# Patient Record
Sex: Female | Born: 1980 | Race: Black or African American | Hispanic: No | Marital: Single
Health system: Midwestern US, Community
[De-identification: ages and names within clinical notes are randomized; demographics above are authoritative.]

---

## 2000-09-10 ENCOUNTER — Inpatient Hospital Stay (HOSPITAL_COMMUNITY): Admission: AD | Admit: 2000-09-10 | Discharge: 2000-09-10 | Payer: Self-pay | Admitting: Obstetrics

## 2000-09-23 ENCOUNTER — Inpatient Hospital Stay (HOSPITAL_COMMUNITY): Admission: AD | Admit: 2000-09-23 | Discharge: 2000-09-23 | Payer: Self-pay | Admitting: *Deleted

## 2000-09-23 ENCOUNTER — Encounter: Payer: Self-pay | Admitting: *Deleted

## 2000-11-07 ENCOUNTER — Other Ambulatory Visit: Admission: RE | Admit: 2000-11-07 | Discharge: 2000-11-07 | Payer: Self-pay | Admitting: Obstetrics and Gynecology

## 2003-10-28 ENCOUNTER — Emergency Department (HOSPITAL_COMMUNITY): Admission: AD | Admit: 2003-10-28 | Discharge: 2003-10-28 | Payer: Self-pay | Admitting: Emergency Medicine

## 2004-11-26 ENCOUNTER — Emergency Department (HOSPITAL_COMMUNITY): Admission: EM | Admit: 2004-11-26 | Discharge: 2004-11-26 | Payer: Self-pay | Admitting: Emergency Medicine

## 2005-08-19 ENCOUNTER — Inpatient Hospital Stay (HOSPITAL_COMMUNITY): Admission: AD | Admit: 2005-08-19 | Discharge: 2005-08-19 | Payer: Self-pay | Admitting: Obstetrics & Gynecology

## 2005-08-31 ENCOUNTER — Inpatient Hospital Stay (HOSPITAL_COMMUNITY): Admission: AD | Admit: 2005-08-31 | Discharge: 2005-08-31 | Payer: Self-pay | Admitting: Obstetrics and Gynecology

## 2005-09-11 ENCOUNTER — Other Ambulatory Visit: Admission: RE | Admit: 2005-09-11 | Discharge: 2005-09-11 | Payer: Self-pay | Admitting: Obstetrics and Gynecology

## 2005-10-13 ENCOUNTER — Inpatient Hospital Stay (HOSPITAL_COMMUNITY): Admission: AD | Admit: 2005-10-13 | Discharge: 2005-10-13 | Payer: Self-pay

## 2005-10-30 ENCOUNTER — Ambulatory Visit (HOSPITAL_COMMUNITY): Admission: RE | Admit: 2005-10-30 | Discharge: 2005-10-30 | Payer: Self-pay | Admitting: Obstetrics and Gynecology

## 2006-01-28 ENCOUNTER — Emergency Department (HOSPITAL_COMMUNITY): Admission: EM | Admit: 2006-01-28 | Discharge: 2006-01-28 | Payer: Self-pay | Admitting: Emergency Medicine

## 2006-02-20 ENCOUNTER — Inpatient Hospital Stay (HOSPITAL_COMMUNITY): Admission: AD | Admit: 2006-02-20 | Discharge: 2006-02-22 | Payer: Self-pay | Admitting: Obstetrics and Gynecology

## 2006-02-27 ENCOUNTER — Inpatient Hospital Stay (HOSPITAL_COMMUNITY): Admission: AD | Admit: 2006-02-27 | Discharge: 2006-02-27 | Payer: Self-pay | Admitting: Obstetrics and Gynecology

## 2006-03-01 ENCOUNTER — Inpatient Hospital Stay (HOSPITAL_COMMUNITY): Admission: AD | Admit: 2006-03-01 | Discharge: 2006-03-02 | Payer: Self-pay | Admitting: Obstetrics and Gynecology

## 2006-04-08 ENCOUNTER — Inpatient Hospital Stay (HOSPITAL_COMMUNITY): Admission: AD | Admit: 2006-04-08 | Discharge: 2006-04-10 | Payer: Self-pay | Admitting: Obstetrics and Gynecology

## 2006-09-16 ENCOUNTER — Emergency Department (HOSPITAL_COMMUNITY): Admission: EM | Admit: 2006-09-16 | Discharge: 2006-09-16 | Payer: Self-pay | Admitting: Family Medicine

## 2006-12-08 ENCOUNTER — Inpatient Hospital Stay (HOSPITAL_COMMUNITY): Admission: AD | Admit: 2006-12-08 | Discharge: 2006-12-08 | Payer: Self-pay | Admitting: Obstetrics and Gynecology

## 2007-07-24 ENCOUNTER — Emergency Department (HOSPITAL_COMMUNITY): Admission: EM | Admit: 2007-07-24 | Discharge: 2007-07-24 | Payer: Self-pay | Admitting: Emergency Medicine

## 2007-08-05 ENCOUNTER — Encounter: Payer: Self-pay | Admitting: Obstetrics and Gynecology

## 2007-08-05 ENCOUNTER — Ambulatory Visit: Payer: Self-pay | Admitting: Obstetrics and Gynecology

## 2007-08-05 ENCOUNTER — Ambulatory Visit (HOSPITAL_COMMUNITY): Admission: AD | Admit: 2007-08-05 | Discharge: 2007-08-05 | Payer: Self-pay | Admitting: Gynecology

## 2007-08-11 ENCOUNTER — Ambulatory Visit: Payer: Self-pay | Admitting: Obstetrics & Gynecology

## 2007-08-12 ENCOUNTER — Ambulatory Visit: Payer: Self-pay | Admitting: Obstetrics and Gynecology

## 2007-08-26 ENCOUNTER — Ambulatory Visit: Payer: Self-pay | Admitting: Obstetrics and Gynecology

## 2007-08-26 ENCOUNTER — Inpatient Hospital Stay (HOSPITAL_COMMUNITY): Admission: AD | Admit: 2007-08-26 | Discharge: 2007-08-26 | Payer: Self-pay | Admitting: Obstetrics & Gynecology

## 2008-03-29 ENCOUNTER — Inpatient Hospital Stay (HOSPITAL_COMMUNITY): Admission: AD | Admit: 2008-03-29 | Discharge: 2008-03-30 | Payer: Self-pay | Admitting: Obstetrics & Gynecology

## 2008-04-04 ENCOUNTER — Inpatient Hospital Stay (HOSPITAL_COMMUNITY): Admission: RE | Admit: 2008-04-04 | Discharge: 2008-04-04 | Payer: Self-pay | Admitting: Obstetrics and Gynecology

## 2008-04-18 ENCOUNTER — Ambulatory Visit (HOSPITAL_COMMUNITY): Admission: RE | Admit: 2008-04-18 | Discharge: 2008-04-18 | Payer: Self-pay | Admitting: Obstetrics and Gynecology

## 2008-07-19 ENCOUNTER — Inpatient Hospital Stay (HOSPITAL_COMMUNITY): Admission: AD | Admit: 2008-07-19 | Discharge: 2008-07-19 | Payer: Self-pay | Admitting: Family Medicine

## 2008-07-21 ENCOUNTER — Inpatient Hospital Stay (HOSPITAL_COMMUNITY): Admission: RE | Admit: 2008-07-21 | Discharge: 2008-07-21 | Payer: Self-pay | Admitting: Family Medicine

## 2008-08-03 ENCOUNTER — Inpatient Hospital Stay (HOSPITAL_COMMUNITY): Admission: RE | Admit: 2008-08-03 | Discharge: 2008-08-03 | Payer: Self-pay | Admitting: Family Medicine

## 2008-08-12 ENCOUNTER — Inpatient Hospital Stay (HOSPITAL_COMMUNITY): Admission: AD | Admit: 2008-08-12 | Discharge: 2008-08-12 | Payer: Self-pay | Admitting: Obstetrics & Gynecology

## 2008-09-13 ENCOUNTER — Inpatient Hospital Stay (HOSPITAL_COMMUNITY): Admission: AD | Admit: 2008-09-13 | Discharge: 2008-09-13 | Payer: Self-pay | Admitting: Family Medicine

## 2008-10-05 ENCOUNTER — Inpatient Hospital Stay (HOSPITAL_COMMUNITY): Admission: AD | Admit: 2008-10-05 | Discharge: 2008-10-05 | Payer: Self-pay | Admitting: Obstetrics

## 2008-10-27 ENCOUNTER — Ambulatory Visit (HOSPITAL_COMMUNITY): Admission: RE | Admit: 2008-10-27 | Discharge: 2008-10-27 | Payer: Self-pay | Admitting: Obstetrics

## 2008-11-21 ENCOUNTER — Inpatient Hospital Stay (HOSPITAL_COMMUNITY): Admission: AD | Admit: 2008-11-21 | Discharge: 2008-11-21 | Payer: Self-pay | Admitting: Obstetrics

## 2008-12-06 ENCOUNTER — Inpatient Hospital Stay (HOSPITAL_COMMUNITY): Admission: AD | Admit: 2008-12-06 | Discharge: 2008-12-06 | Payer: Self-pay | Admitting: Obstetrics

## 2009-01-06 ENCOUNTER — Ambulatory Visit (HOSPITAL_COMMUNITY): Admission: RE | Admit: 2009-01-06 | Discharge: 2009-01-06 | Payer: Self-pay | Admitting: Obstetrics

## 2009-01-18 ENCOUNTER — Inpatient Hospital Stay (HOSPITAL_COMMUNITY): Admission: AD | Admit: 2009-01-18 | Discharge: 2009-01-19 | Payer: Self-pay | Admitting: Obstetrics

## 2009-01-27 ENCOUNTER — Ambulatory Visit (HOSPITAL_COMMUNITY): Admission: RE | Admit: 2009-01-27 | Discharge: 2009-01-27 | Payer: Self-pay | Admitting: Obstetrics

## 2009-02-02 ENCOUNTER — Encounter: Payer: Self-pay | Admitting: Obstetrics

## 2009-02-02 ENCOUNTER — Inpatient Hospital Stay (HOSPITAL_COMMUNITY): Admission: AD | Admit: 2009-02-02 | Discharge: 2009-02-02 | Payer: Self-pay | Admitting: Obstetrics

## 2009-02-04 ENCOUNTER — Encounter (INDEPENDENT_AMBULATORY_CARE_PROVIDER_SITE_OTHER): Payer: Self-pay | Admitting: Obstetrics

## 2009-02-04 ENCOUNTER — Inpatient Hospital Stay (HOSPITAL_COMMUNITY): Admission: AD | Admit: 2009-02-04 | Discharge: 2009-02-07 | Payer: Self-pay | Admitting: Obstetrics

## 2009-02-14 ENCOUNTER — Inpatient Hospital Stay (HOSPITAL_COMMUNITY): Admission: AD | Admit: 2009-02-14 | Discharge: 2009-02-15 | Payer: Self-pay | Admitting: Obstetrics

## 2009-10-11 ENCOUNTER — Emergency Department (HOSPITAL_COMMUNITY): Admission: EM | Admit: 2009-10-11 | Discharge: 2009-10-11 | Payer: Self-pay | Admitting: Emergency Medicine

## 2010-05-19 IMAGING — US US OB TRANSVAGINAL
1 series · 14 of 28 positions shown · non-contrast
Comparison: None

CLINICAL DATA: Heavy bleeding.

OBSTETRIC <14 WK ULTRASOUND
TECHNIQUE: Transabdominal ultrasound was performed for evaluation
of the gestation as well as the maternal uterus and adnexal
regions.

[Series 1: us ob transvaginal · 14 of 54 slices shown]
[im 2/54]
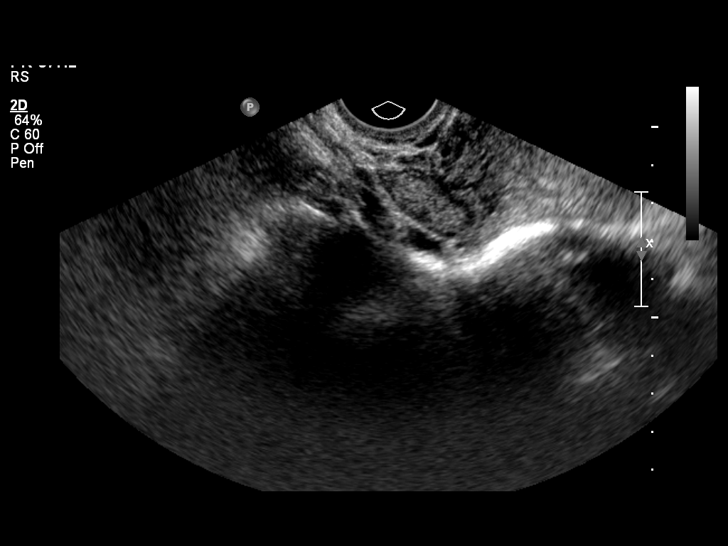
[im 6/54]
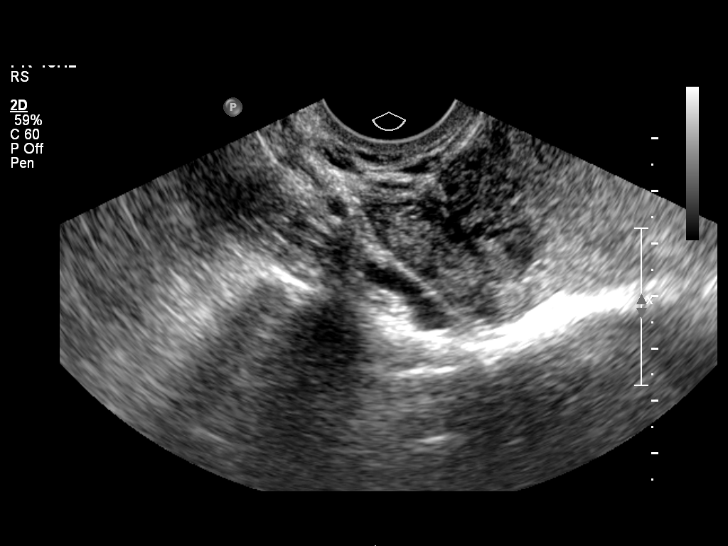
[im 10/54]
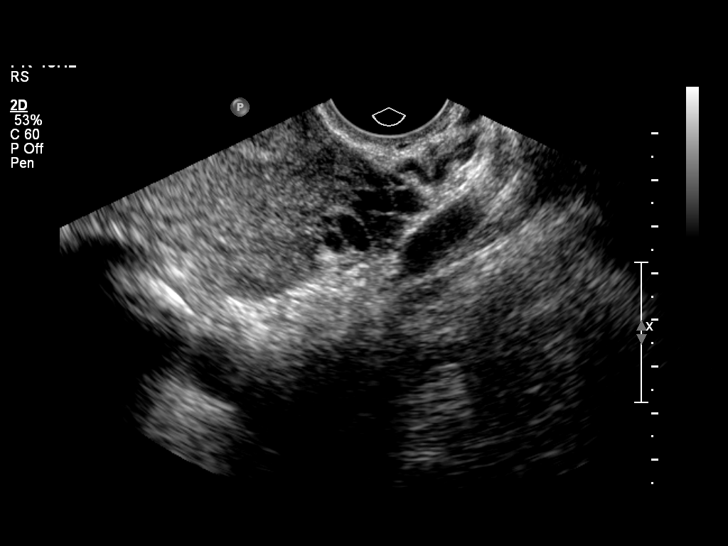
[im 14/54]
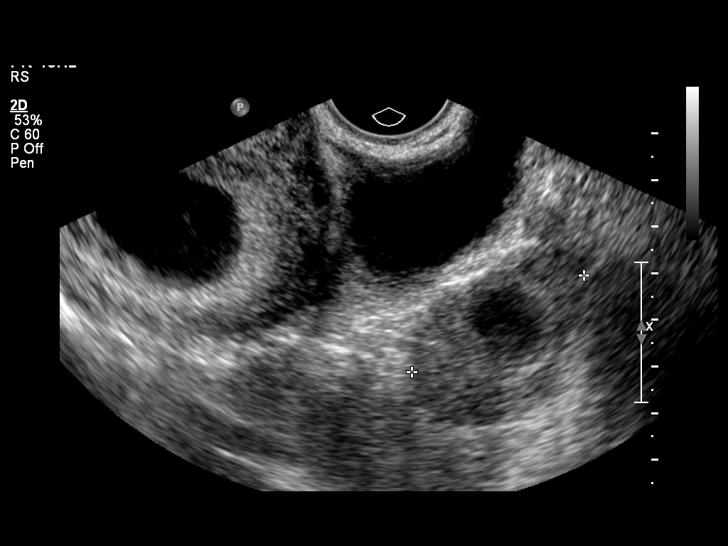
[im 18/54]
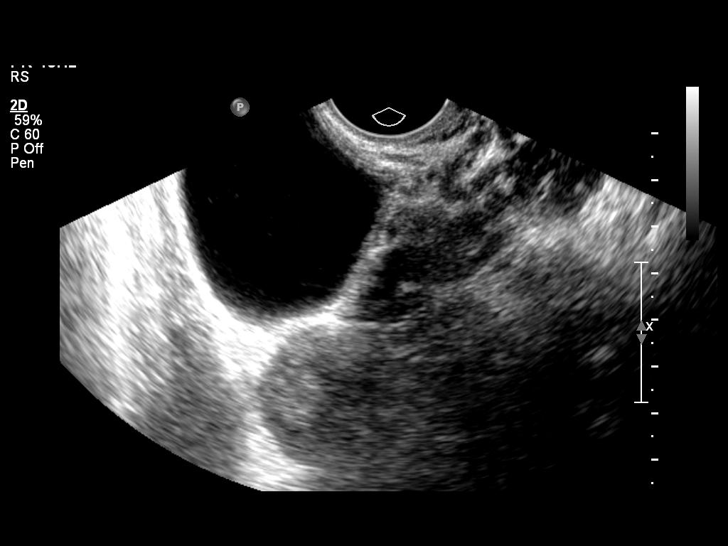
[im 22/54]
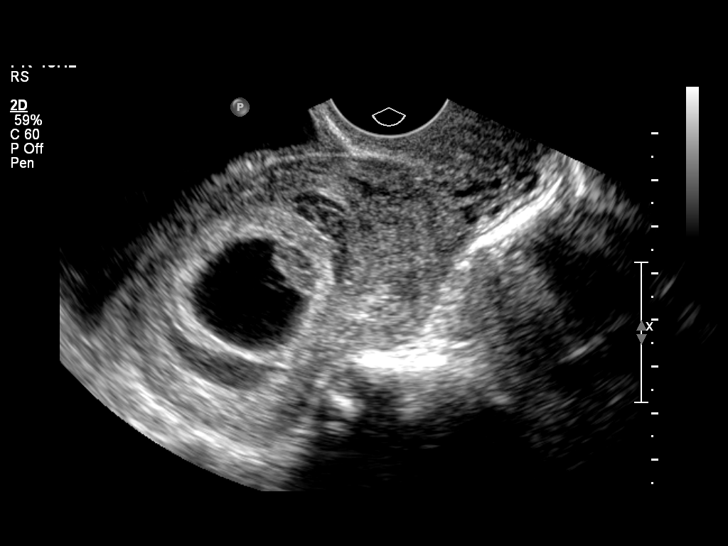
[im 26/54]
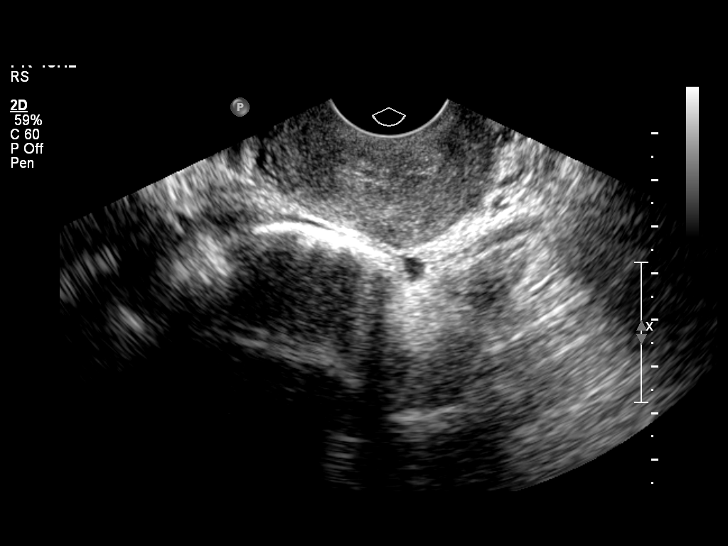
[im 30/54]
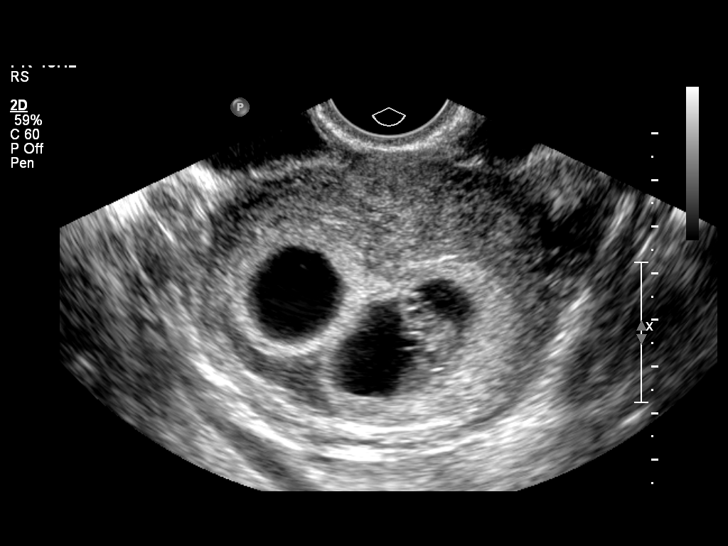
[im 34/54]
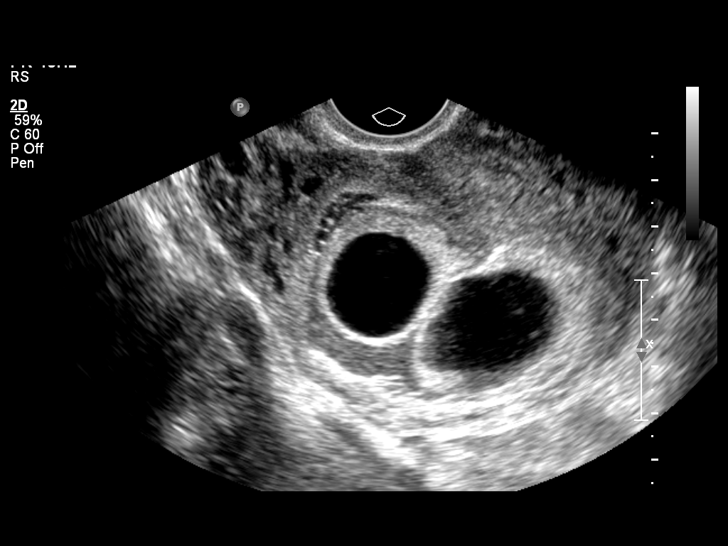
[im 38/54]
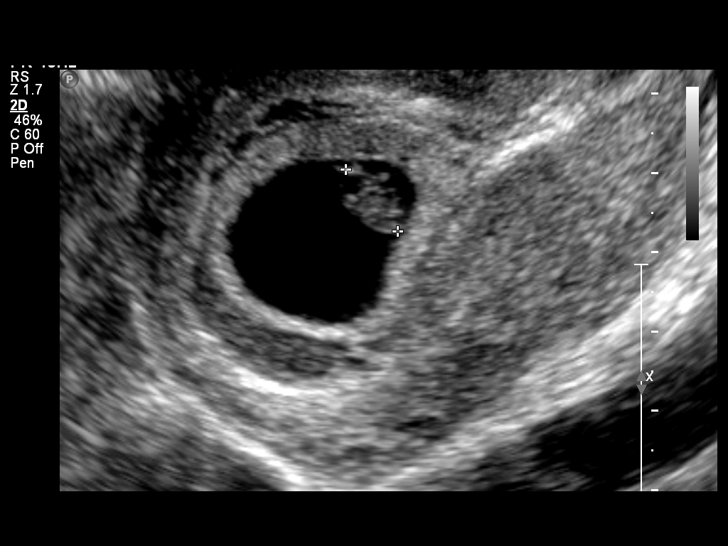
[im 42/54]
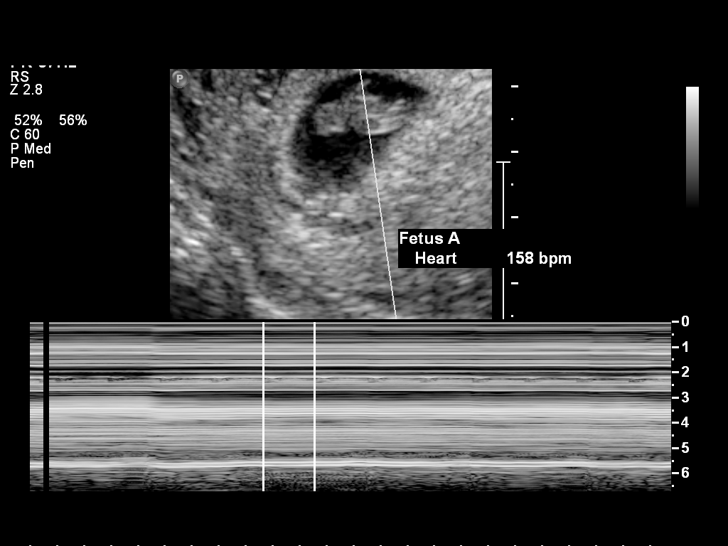
[im 46/54]
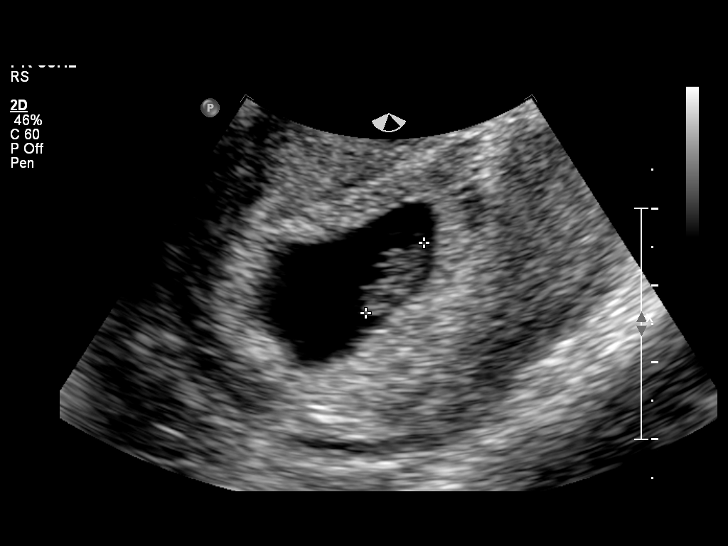
[im 50/54]
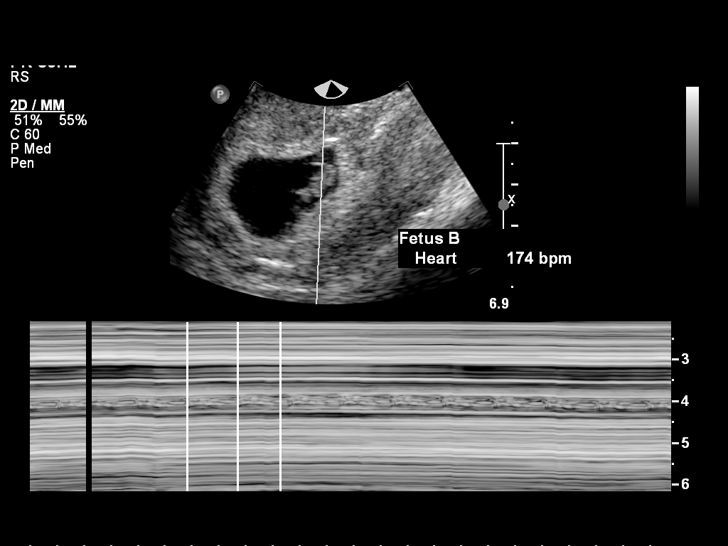
[im 54/54]
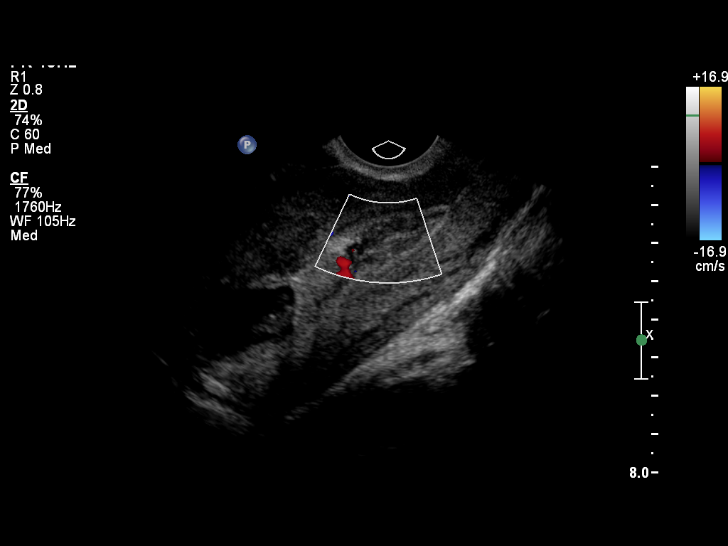

[14 of 28 positions shown; findings below may reference images not displayed]

FINDINGS: Intrauterine gestational sac: 2]
Yolk sac: Yes
Embryo: Yes
Cardiac Activity: Yes
Heart Rate: Twin A 158 bpm; twin B 174 bpm

Twin A: CRL:  1.09 cm         7w  2d            US EDC: 03/29/2009

Twin B:  CRL:  [1.16 cm...]         [7]w  [3]d        US EDC:
[03/29/2009]

Maternal uterus/Adnexae:
Small amount of subchorionic hemorrhage is identified.  Right ovary
unremarkable.  Corpus luteum cyst is seen in the left ovary.  Small
amount of free fluid.
IMPRESSION: 1.  Twin intrauterine pregnancy with small subchorionic hemorrhage.
2.  Small free fluid.

## 2010-08-03 IMAGING — US US OB DETAIL+14 WK
2 series · 14 of 28 positions shown · non-contrast
Comparison: none

OBSTETRICAL ULTRASOUND:
 This ultrasound exam was performed in the [HOSPITAL] Ultrasound Department.  The OB US report was generated in the AS system, and faxed to the ordering physician.  This report is also available in [REDACTED] PACS.

[Series 1: us ob detail +14 wk · 1 of 12 slices shown (1 of 2)]
[im 12/12]
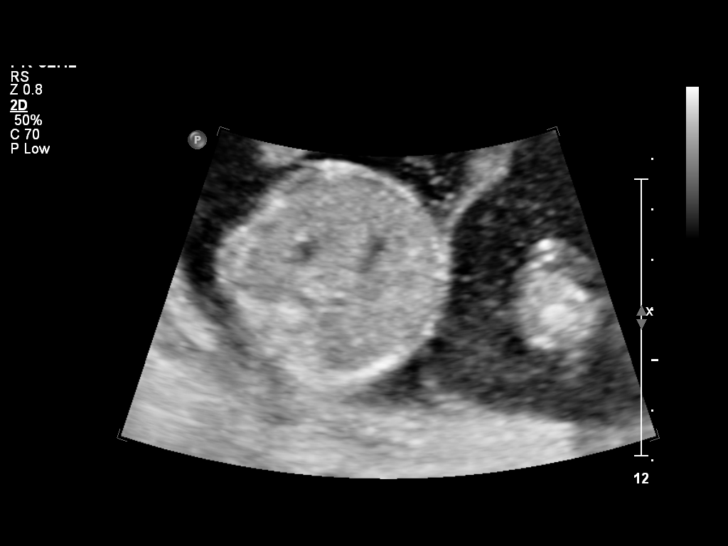

[Series 1: us ob detail +14 wk · 13 of 174 slices shown (2 of 2)]
[im 7/174]
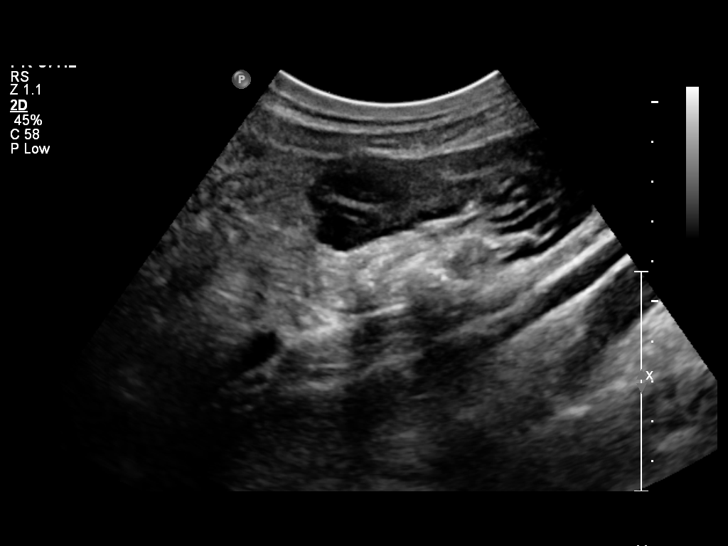
[im 21/174]
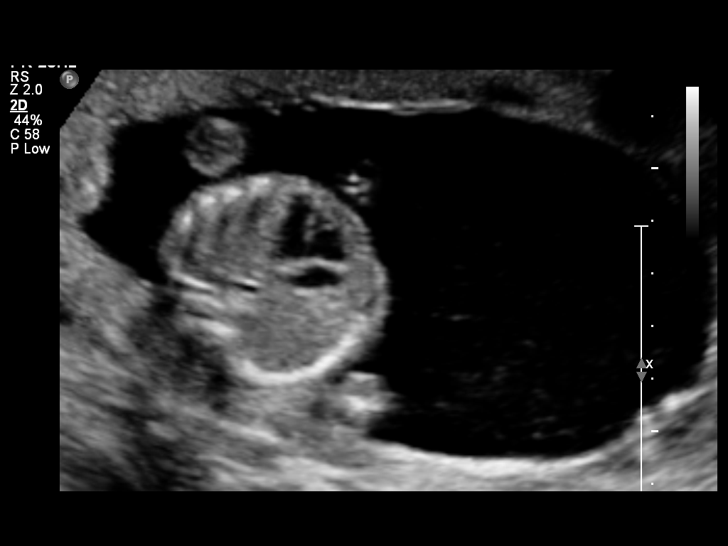
[im 35/174]
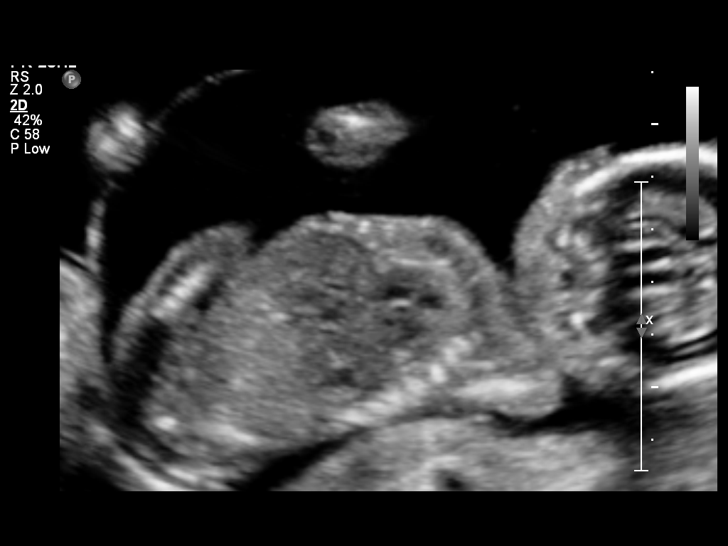
[im 49/174]
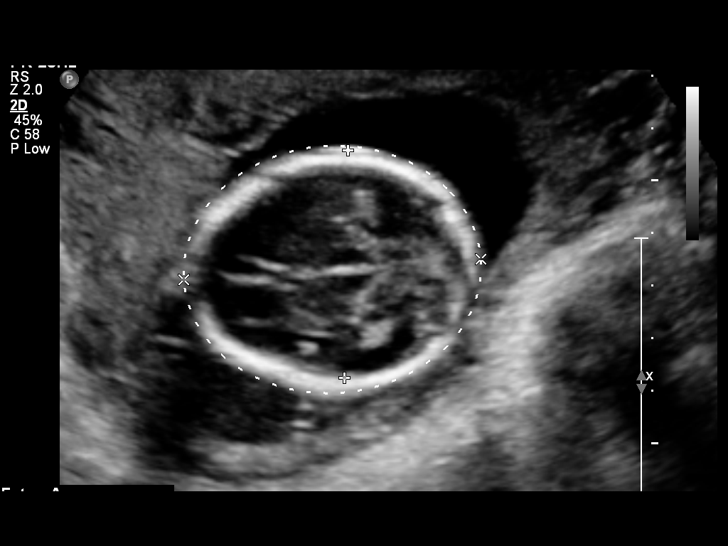
[im 63/174]
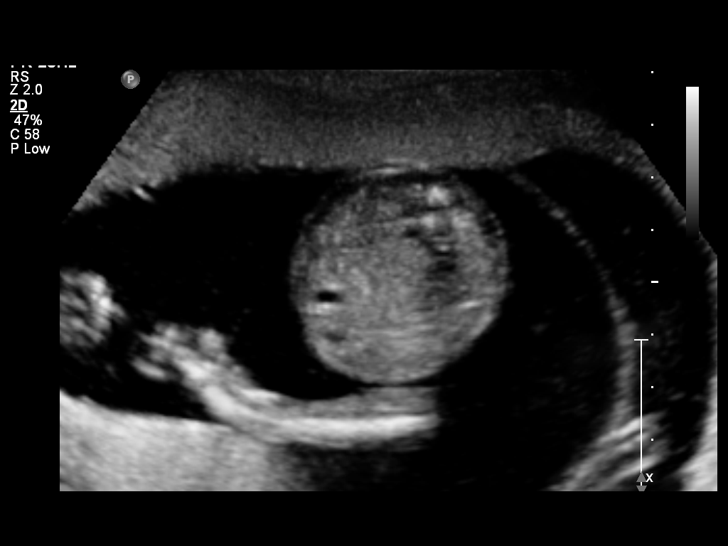
[im 77/174]
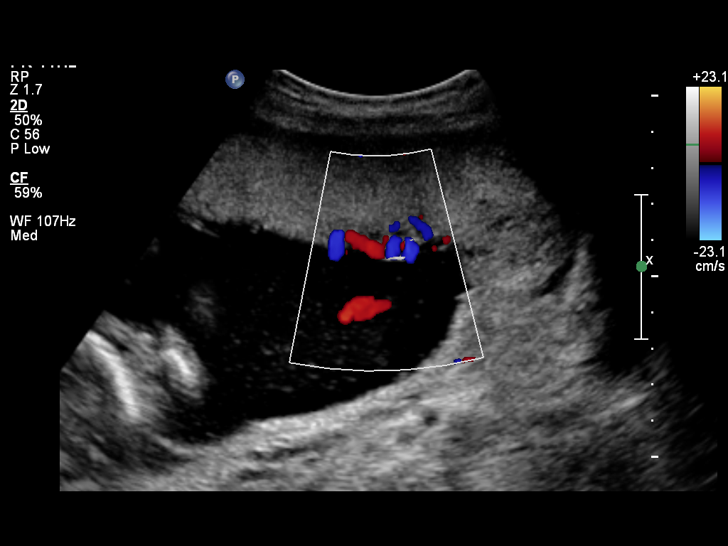
[im 90/174]
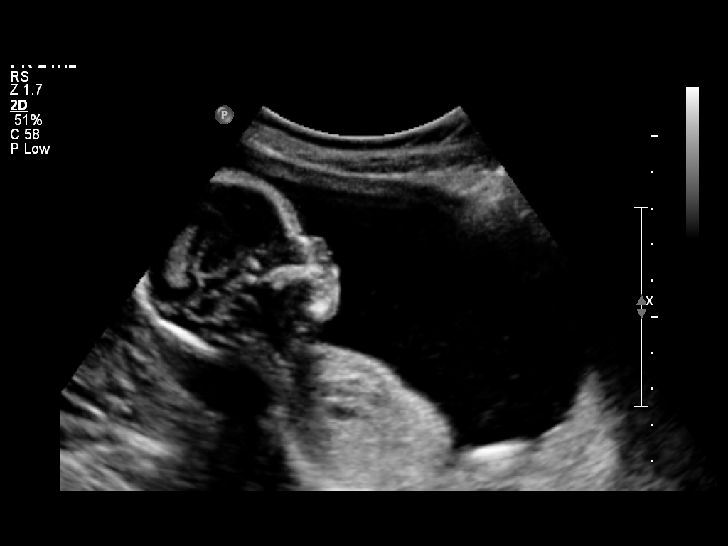
[im 104/174]
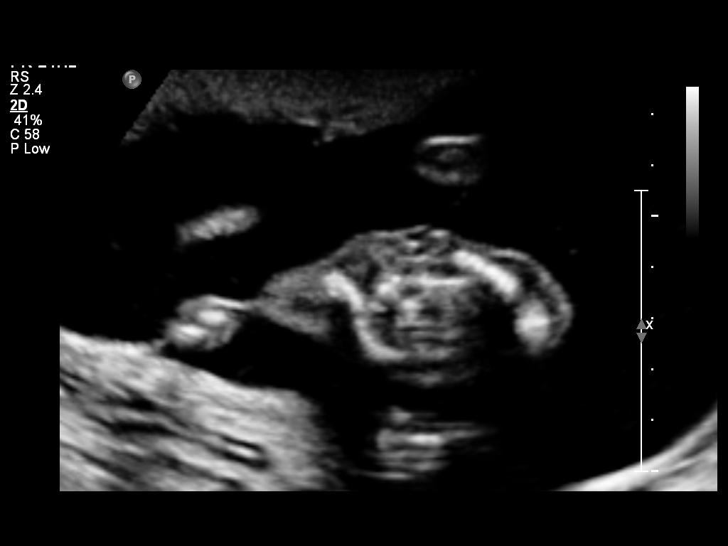
[im 118/174]
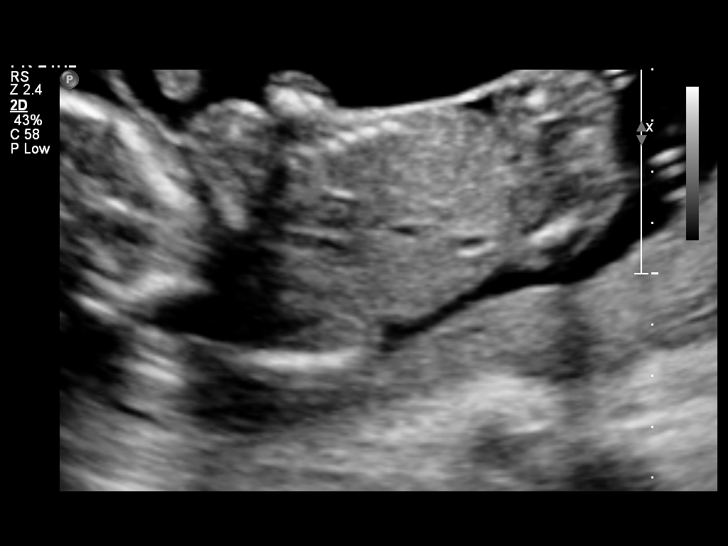
[im 132/174]
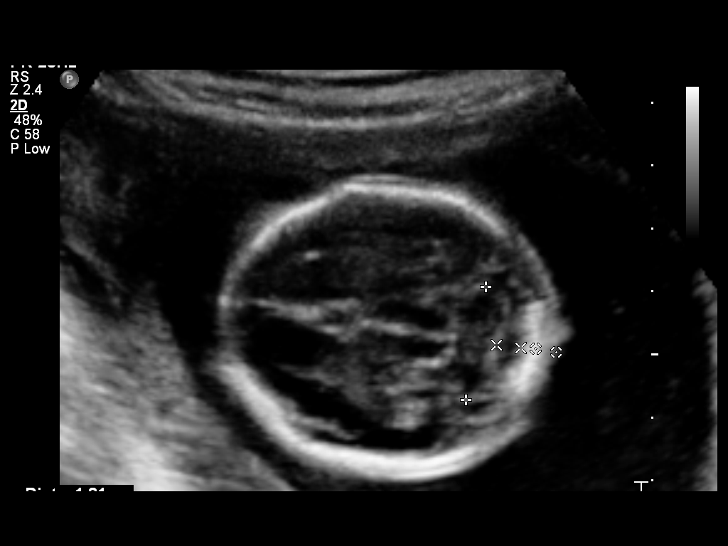
[im 146/174]
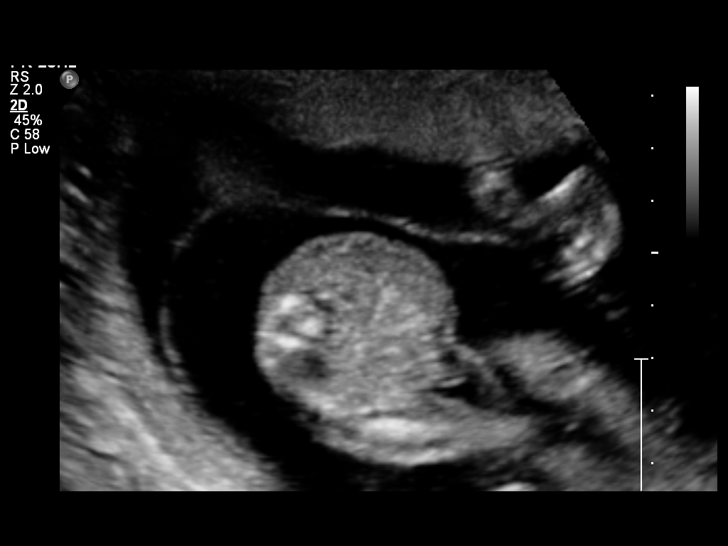
[im 160/174]
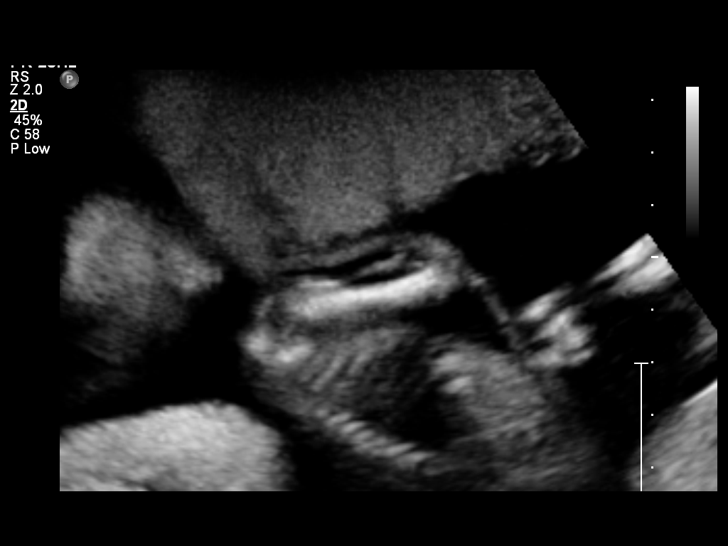
[im 174/174]
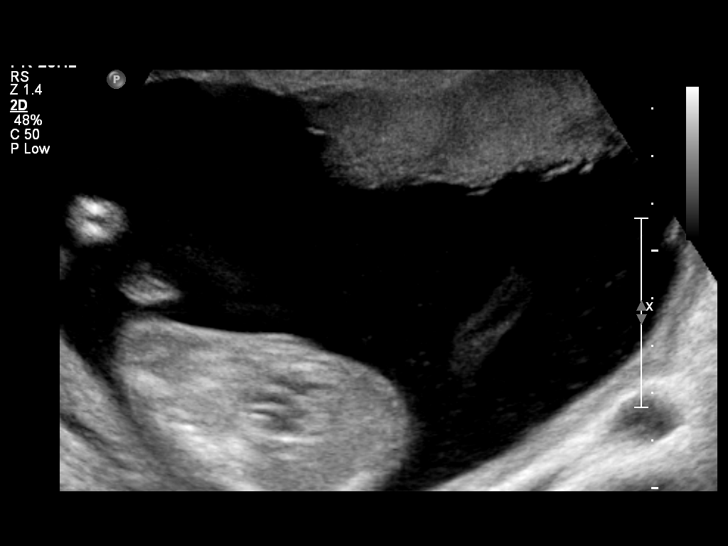

[14 of 28 positions shown; findings below may reference images not displayed]

IMPRESSION: See AS Obstetric US report.

## 2011-01-13 ENCOUNTER — Encounter: Payer: Self-pay | Admitting: Obstetrics

## 2011-01-14 ENCOUNTER — Encounter: Payer: Self-pay | Admitting: Family Medicine

## 2011-04-09 LAB — CBC
HCT: 25.8 % — ABNORMAL LOW (ref 36.0–46.0)
Hemoglobin: 7.6 g/dL — CL (ref 12.0–15.0)
Hemoglobin: 8.5 g/dL — ABNORMAL LOW (ref 12.0–15.0)
Hemoglobin: 8.4 g/dL — ABNORMAL LOW (ref 12.0–15.0)
MCHC: 33.1 g/dL (ref 30.0–36.0)
MCV: 87.6 fL (ref 78.0–100.0)
Platelets: 107 10*3/uL — ABNORMAL LOW (ref 150–400)
Platelets: 133 10*3/uL — ABNORMAL LOW (ref 150–400)
RBC: 2.94 MIL/uL — ABNORMAL LOW (ref 3.87–5.11)
RBC: 2.92 MIL/uL — ABNORMAL LOW (ref 3.87–5.11)
RDW: 14.5 % (ref 11.5–15.5)
RDW: 15.2 % (ref 11.5–15.5)
WBC: 12.6 10*3/uL — ABNORMAL HIGH (ref 4.0–10.5)
WBC: 10.8 10*3/uL — ABNORMAL HIGH (ref 4.0–10.5)

## 2011-04-09 LAB — COMPREHENSIVE METABOLIC PANEL
ALT: 18 U/L (ref 0–35)
AST: 17 U/L (ref 0–37)
Albumin: 2.7 g/dL — ABNORMAL LOW (ref 3.5–5.2)
Alkaline Phosphatase: 104 U/L (ref 39–117)
BUN: 12 mg/dL (ref 6–23)
CO2: 27 mEq/L (ref 19–32)
Calcium: 8.5 mg/dL (ref 8.4–10.5)
Chloride: 108 mEq/L (ref 96–112)
Creatinine, Ser: 0.55 mg/dL (ref 0.4–1.2)
GFR calc Af Amer: >60 mL/min (ref >60)
GFR calc non Af Amer: >60 mL/min (ref >60)
Glucose, Bld: 86 mg/dL (ref 70–99)
Potassium: 3.6 mEq/L (ref 3.5–5.1)
Sodium: 140 mEq/L (ref 135–145)
Total Bilirubin: 0.3 mg/dL (ref 0.3–1.2)
Total Protein: 6 g/dL (ref 6.0–8.3)

## 2011-04-09 LAB — RPR: RPR Ser Ql: NONREACTIVE

## 2011-04-09 LAB — LACTATE DEHYDROGENASE: LDH: 193 U/L (ref 94–250)

## 2011-05-07 NOTE — Consult Note (Signed)
NAME:  Maria, Maldonado NO.:  [id number]   MEDICAL RECORD NO.:  [id number]          PATIENT TYPE:  MAT   LOCATION:  MATC                          [facility]   PHYSICIAN:  Tilda Burrow, M.D. DATE OF BIRTH:  26-Jun-1981   DATE OF CONSULTATION:  DATE OF DISCHARGE:  08/26/2007                                 CONSULTATION   CHIEF COMPLAINT:  Back pain, fatigue, three weeks status post surgery  for ectopic pregnancy.   HISTORY:  This 30 year old female, well known due to treatment three  weeks ago for ectopic pregnancy, is seen in  MAU for complaints of dull  back ache and sense of fatigue.  She was operated on with linear  salpingoscopy performed for ectopic pregnancy when quantitative hCG was  500, three weeks ago.  She kept one follow up visit in the GYN Clinic,  and then failed to keep any further appointment.  Blood type is  confirmed as 0 positive.   LABORATORY DATA:  Includes hemoglobin of 12, hematocrit 37, quantitative  hCG of 46.  Ultrasound shows a small amount of fluid in the cul-de-sac  with small cysts on both ovaries.  There is no peritubal mass.  She is  not in any acute discomfort at this point in time.   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   PLAN:  Initiate methotrexate protocol for ectopic pregnancy tonight with  50 mg per meter squared of methotrexate to be administered IM.   DISCHARGE MEDICATIONS:  Motrin 800 mg, 30 tablets, one p.o. 8 hours  p.r.n. cramping.  Ortho Tri-Cyclen load, on pill daily beginning Sunday.   Out of work times three days.   FOLLOW UP:  Follow up one week.  Follow up day #4 serum hCG through GYN  Clinic, and seven days later for repeat hCG. Follow up at [facility] in  one week.      Tilda Burrow, M.D.  Electronically Signed     JVF/MEDQ  D:  08/26/2007  T:  08/27/2007  Job:  [id number]

## 2011-05-07 NOTE — Op Note (Signed)
NAME:  Maria Maldonado, Maria Maldonado NO.:  [id number]   MEDICAL RECORD NO.:  [id number]          PATIENT TYPE:  INP   LOCATION:  9320                          [facility]ILITY:  [facility]   PHYSICIAN:  Kathreen Cosier, M.D.DATE OF BIRTH:  Apr 11, 1981   DATE OF PROCEDURE:  DATE OF DISCHARGE:                               OPERATIVE REPORT   PREOPERATIVE DIAGNOSES:  Intrauterine pregnancy with twins at 32 weeks  and 5 days, active labor and refusal of tocolysis, and demand for  cesarean section and tubal ligation.   POSTOPERATIVE DIAGNOSES:  Intrauterine pregnancy with twins at 32 weeks  and 5 days, active labor and refusal of tocolysis, and demand for  cesarean section and tubal ligation.   ANESTHESIA:  Spinal.   SURGEON:  Kathreen Cosier, MD   PROCEDURE:  After the spinal administered, the patient in the supine  position.  Abdomen prepped and draped, bladder emptied with a Foley  catheter.  Transverse suprapubic incision made and carried down to the  rectus fascia.  Fascia cleaned and incised to length of the incision.  The recti muscles were retracted laterally.  Peritoneum was incised  longitudinally.  Transverse incision made in the visceral peritoneum  above the bladder.  Bladder mobilized inferiorly.  Transverse lower  uterine incision made.  Twin A was delivered as a double footling breech  female Apgars 7 and 8, weighing 4 pounds 1 ounce.  Twin B female, Apgars 4  and 8, weighing 3 pounds 12-ounce, delivered as a double footling  breech.  Placenta anterior fundal removed manually and sent to  pathology.  Uterine cavity cleaned with dry laps.  Uterine incision  closed in 1 layer with continuous suture of #1 chromic.  Hemostasis was  satisfactory.  Bladder flap reattached with 2-0 chromic.  Right tube  grasped at the mid portion with a Babcock clamp, 0 plain suture placed  at the mesosalpinx below the portion of the tube and this clamp, this  was tied.  Approximately, 1 inch of  tube transected.  Procedure was done  in the similar fashion on the other side.  Lap and sponge counts  correct.  Abdomen was closed in layers.  Peritoneum continuous suture of  2-0 chromic, fascia continuous suture of Dexon, and the skin closed with  staples.  Blood loss was 800 mL.  The patient tolerated the procedure  well and taken to recovery room in good condition.           ______________________________  Kathreen Cosier, M.D.     BAM/MEDQ  D:  02/04/2009  T:  02/05/2009  Job:  [id number]

## 2011-05-07 NOTE — Discharge Summary (Signed)
NAME:  Maria Maldonado, Maria Maldonado NO.:  [id number]   MEDICAL RECORD NO.:  [id number]          PATIENT TYPE:  INP   LOCATION:  9320                          [facility]ILITY[facility]   PHYSICIAN:  Kathreen Cosier, M.D.DATE OF BIRTH:  05/30/1981   DATE OF ADMISSION:  02/04/2009  DATE OF DISCHARGE:  02/07/2009                               DISCHARGE SUMMARY   The patient is a 30 year old gravida 2, para 2-0-2-2, [facility] March 29, 2009.  She was 32 weeks and 5 days pregnant with twins.  The patient had been  having irregular contractions for weeks and refused admission for  control of contractions.  This was the third time she had been here and  refused tocolytics.  She was 4 cm, 90% bulging membranes.  She was not  feeling her contractions.  She refused magnesium but would consent to  betamethasone.  The patient also demanded a C-section and tubal  ligation.  She continued to contract and eventually was 7 cm dilated.  She underwent primary low-transverse cesarean section and tubal  ligation.  Twin A breech, Apgars 7 and 8, 4 pounds 1 ounce double  footling female; twin B, Apgars 4 and 8, 3 pounds 12 ounces double  footling breech and tubal ligation.  Postoperatively, her hemoglobin was  7.6.  She was asymptomatic and she received ferrous sulfate 325 p.o.  b.i.d.  The patient did well postop and was discharged on the third  postoperative day, ambulatory, on regular diet, to see me in 6 weeks.   DISCHARGE DIAGNOSIS:  Status post primary low-transverse cesarean  section and tubal ligation with twin gestation of premature labor and  delivery.           ______________________________  Kathreen Cosier, M.D.     BAM/MEDQ  D:  03/08/2009  T:  03/08/2009  Job:  [id number]

## 2011-05-07 NOTE — Op Note (Signed)
NAME:  Maria Maldonado, Maria Maldonado NO.:  [id number]   MEDICAL RECORD NO.:  [id number]          PATIENT TYPE:  AMB   LOCATION:  MATC                          [facility]   PHYSICIAN:  Tilda Burrow, M.D. DATE OF BIRTH:  09/14/1981   DATE OF PROCEDURE:  08/05/2007  DATE OF DISCHARGE:                               OPERATIVE REPORT   PREOPERATIVE DIAGNOSIS:  Right ectopic pregnancy with bleeding.   POSTOPERATIVE DIAGNOSIS:  Right ectopic pregnancy with bleeding.   PROCEDURE:  Laparoscopic right linear salpingostomy.   SURGEON:  Tilda Burrow, M.D.   ASSISTANT:  None.   ANESTHESIA:  General.   COMPLICATIONS:  None.   FINDINGS:  Distended right tube with ampullary ectopic pregnancy. Normal  appearing fimbria on the right. Grossly normal left tube and ovary and  no evidence of pelvic adhesions. Clear cul-de-sac. Retroverted,  retroflexed uterus.  Blood type Rh positive.   INDICATIONS:  30 year old female gravida 3, para 2, with diagnosis of  pregnancy tonight with ultrasound confirmation of right ectopic  pregnancy admitted for laparoscopic treatment of ectopic.   DETAILS OF PROCEDURE:  The patient was taken to the operating room and  prepped and draped for a combined abdominal and vaginal procedure.  In  and out catheterization of the bladder was performed.  A single tooth  tenaculum was used to grasp the cervix.  The infraumbilical vertical 1  cm skin incision was made as well as a transverse suprapubic 1 cm  incision.  The Veress needle was introduced through the umbilicus, water  droplet technique used to confirm intraperitoneal location, the 11 mm  trocar introduced after pneumoperitoneum achieved good abdominal  distension.  The laparoscopic camera revealed normal bowel with some  evidence of old dark blood in the pelvis.  The ectopic was documented on  the right.  A suprapubic 5 mm trocar was placed under direct  visualization with care and attention  directed to the pelvis. Photos  were taken through the case.  The 5 mm for was moved to the right lower  quadrant and the 10-11 port placed suprapubically.   Attention was directed to the pelvis where the distended right tube was  identified, photos were attempted but apparently were unsuccessful to  document the procedure findings.  The tube was infiltrated with  Pitressin solution, 20 units in 100 mL with 4 mL utilized to infiltrate  the infundibulopelvic ligament and the area of the ectopic pregnancy.  The gyrus needle tip cautery was used to perform an antimesenteric 1.5  cm linear incision on the tube with prompt extrusion of some tissue  consistent with ectopic pregnancy.  Portions of it were grasped with  grasper and placed as a specimen.  The specimen was very fragile and  disrupted.  There was only a small amount of tissue.  The opening was  opened further and the distended tube, itself, could be cannulated with  the aqua dissector.  The aqua dissection was then used to flush out the  clots in the tip of the tube and the ectopic tissue, after performing  the aqua dissection, the tube appeared smooth in its contour as well as  could be seen through the opening.  The fimbria of the tube could easily  be cannulated with the aqua dissector, as well, and irrigated, as well.  A small portion of the specimen and some of the clot had been taken as  specimen.  Suctioning of blood and the irrigation fluid was performed at  this time and photos taken, this time successfully captured revealing in  photo 1 the grossly normal fimbria on the involved right tube, photo 2  showing the small opening on the ampullary antimesenteric portion of the  tube, photo 3 shows a grossly normal mobile left tube and ovary without  adhesions, and photo 4 is again a photo of the right tube at the site of  the salpingostomy.  The cul-de-sac was inspected and showed no evidence  of endometriosis or adhesions.    The pelvis was irrigated copiously, approximately 100 mL saline was left  in the abdomen, the abdomen deflated, the laparoscopic trocars removed.  The suprapubic and umbilical sites were closed carefully at the fascial  level with good deflation of the abdominal gas.  The right lower  quadrant site only required subcuticular closure.  All three were closed  with 4-0 Dexon subcuticularly.  Steri-Strips were placed and the patient  allowed to go to recovery room in good condition.  Blood type, as  mentioned, was Rh positive.  The patient will be followed up next week  in GYN clinic for quantitative HCG and incision check.      Tilda Burrow, M.D.  Electronically Signed     JVF/MEDQ  D:  08/05/2007  T:  08/06/2007  Job:  [id number]

## 2011-05-07 NOTE — H&P (Signed)
NAME:  Maria Maldonado, Maria Maldonado NO.:  [id number]   MEDICAL RECORD NO.:  [id number]          PATIENT TYPE:  INP   LOCATION:  9320                          [facility]ILITY[facility]   PHYSICIAN:  Kathreen Cosier, M.D.DATE OF BIRTH:  Jul 14, 1981   DATE OF ADMISSION:  02/04/2009  DATE OF DISCHARGE:                              HISTORY & PHYSICAL   The patient is a 30 year old gravida 5, para 2-0-2-2, [facility] March 29, 2009,  32 weeks and 5 days pregnant with twins followed by the Prenatal Center.  She was admitted with a regular contractions and she has been doing this  for at least 3 weeks.  Every week she comes to the hospital and refuses  tocolytics on admission.  She came in this evening because she felt  something strange in the vagina.  She was 4-cm, 90%, with bulging  membranes and irregular contractions more so which she did not felt.  She refused any form of tocolytics and she got one dose of betamethasone  12.5 mg and she was started on ampicillin and erythromycin.  She started  to have the irregular contractions on the monitor at 7:30 p.m.  She was  now 7-8 cm.  The patient also demanded to be delivered by C-section and  wanted tubal ligation.   PHYSICAL EXAMINATION:  GENERAL:  A well-developed female in labor.  HEENT:  Negative.  LUNGS:  Clear.  HEART:  Regular rhythm.  No murmurs.  No gallops.  BREASTS:  No masses.  ABDOMEN:  Distended with twins.  PELVIC:  As described above.  EXTREMITIES:  Negative.           ______________________________  Kathreen Cosier, M.D.     BAM/MEDQ  D:  02/04/2009  T:  02/05/2009  Job:  [id number]
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
## 2011-05-10 NOTE — Discharge Summary (Signed)
NAME:  NOOR, WITTE                 ACCOUNT NO.:  [id number]   MEDICAL RECORD NO.:  [id number]          PATIENT TYPE:  INP   LOCATION:  9149                          [facility]   PHYSICIAN:  Naima A. Dillard, M.D. DATE OF BIRTH:  September 14, 1981   DATE OF ADMISSION:  02/20/2006  DATE OF DISCHARGE:  02/22/2006                                 DISCHARGE SUMMARY   ADMISSION DIAGNOSES:  1.  Intrauterine pregnancy at 30 weeks.  2.  Preterm contractions.  3.  Positive fetal fibronectin.  4.  History of preterm contractions at term delivery.  5.  Bacterial vaginosis.  6.  Declined tocolytics.   DISCHARGE DIAGNOSES:  1.  Intrauterine pregnancy at 30 weeks.  2.  Preterm contractions.  3.  Positive fetal fibronectin.  4.  History of preterm contractions at term delivery.  5.  Bacterial vaginosis.  6.  Declined tocolytics.  7.  Anemia.   HOSPITAL COURSE:  Mrs. Fellows is a 30 year old, gravida 3, para 1-0-1-1 at 73-  2/7 weeks who is seen at the [facility] office on February 20, 2006  with complaints of pelvic pain and pressure.  Her cervix at the office was  noted to be 1 cm and long. She was sent to the MAU and was found to be  contracting approximately every three minutes. Her cervix was reevaluated  and was noted to be unchanged.  The patient declined terbutaline, magnesium  sulfate and Procardia.  She initially declined betamethasone but then  elected to pursue that option and, therefore, was admitted.  Her fetal  fibronectin on the same day was positive as well.  Ultrasonography on this  same date shows estimated fetal weight at 1793 g, cervix 3.9 cm long, vertex  presentation with normal fluid.  Wet prep from this date shows positive  bacterial vaginosis.  After discussion with Dr. Pennie Rushing, where tocolysis was  recommended, at least during the betamethasone series, the patient continued  to refuse all tocolytics under any circumstances during this  hospitalization.  She was  started on Flagyl 500 mg p.o. b.i.d. for seven  days. She also was started on the betamethasone series 12.5 mg IM and then  repeated in 24 hours.  She had a NICU consultation on February 21, 2006. On that  same day, she was feeling well.  She had sporadic nausea and vomiting during  her first night but that resolved spontaneously.  She reported an occasional  cramp but not as frequently as the day prior.  Her lab data on March 2  showed hemoglobin 9.1, hematocrit 26.2, white blood cell count 10.3,  platelets 153,000.  Her CMET was within normal limits.  Later on in the  morning, on March 2, she complained of leaking of fluid during coughing.  Nitrazine and ferning were negative and there was no pooling noted.  At  approximately 8 p.m. on the evening of March 2, she started feeling more  pain. Her cervix remained unchanged despite contractions every three to  seven minutes.  She received Stadol and Phenergan for her discomfort.  On  the morning of February 22, 2006, she was feeling well.  She had no significant  contractions since the evening prior.  Her gonorrhea and Chlamydia culture  were negative.  Clean catch urine culture and group B strep were still  pending.  Her white blood cell count was slightly elevated at 15.2.  Hemoglobin was 8.3.  Neutrophils were elevated as well, most likely due to  steroids.  Her cervix remained fingertip, long, presenting part high;  therefore, she was deemed to have received the full benefit of her hospital  stay.   DISCHARGE INSTRUCTIONS:  Pelvic rest and bed rest.   DISCHARGE MEDICATIONS:  1.  Flagyl 500 mg one p.o. b.i.d. for seven days.  2.  Colace one to two p.o. daily.  3.  Prenatal vitamin one p.o. daily.  4.  FeSO4 one p.o. daily.   FOLLOW UP:  [facility] in one week or as needed.      Cam Hai, C.N.M.      Naima A. Normand Sloop, M.D.  Electronically Signed    KS/MEDQ  D:  02/22/2006  T:  02/24/2006  Job:  [id number]
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
## 2011-09-17 LAB — ABO/RH: ABO/RH(D): O POS

## 2011-09-17 LAB — URINALYSIS, ROUTINE W REFLEX MICROSCOPIC
Bilirubin Urine: NEGATIVE
Glucose, UA: NEGATIVE
Hgb urine dipstick: NEGATIVE
Protein, ur: NEGATIVE
Specific Gravity, Urine: 1.015
Urobilinogen, UA: 0.2

## 2011-09-17 LAB — CBC
HCT: 39.8
Hemoglobin: 13.6
RBC: 4.27
RDW: 13.1

## 2011-09-17 LAB — POCT PREGNANCY, URINE
Operator id: 22333
Preg Test, Ur: POSITIVE

## 2011-09-17 LAB — WET PREP, GENITAL
Clue Cells Wet Prep HPF POC: NONE SEEN
Trich, Wet Prep: NONE SEEN
Yeast Wet Prep HPF POC: NONE SEEN

## 2011-09-17 LAB — GC/CHLAMYDIA PROBE AMP, GENITAL: GC Probe Amp, Genital: NEGATIVE

## 2011-09-20 LAB — CBC
HCT: 38
Hemoglobin: 12.5
RBC: 3.96
RDW: 13.4
WBC: 9.5

## 2011-09-20 LAB — URINALYSIS, ROUTINE W REFLEX MICROSCOPIC
Bilirubin Urine: NEGATIVE
Glucose, UA: NEGATIVE
Hgb urine dipstick: NEGATIVE
Ketones, ur: 15 — AB
Protein, ur: NEGATIVE
pH: 6.5

## 2011-09-20 LAB — WET PREP, GENITAL: Yeast Wet Prep HPF POC: NONE SEEN

## 2011-09-20 LAB — GC/CHLAMYDIA PROBE AMP, GENITAL: Chlamydia, DNA Probe: NEGATIVE

## 2011-09-20 LAB — ABO/RH: ABO/RH(D): O POS

## 2011-09-20 LAB — URINE MICROSCOPIC-ADD ON

## 2011-09-20 LAB — HCG, QUANTITATIVE, PREGNANCY: hCG, Beta Chain, Quant, S: 581 — ABNORMAL HIGH

## 2011-09-20 LAB — POCT PREGNANCY, URINE: Preg Test, Ur: POSITIVE

## 2011-09-23 LAB — URINALYSIS, ROUTINE W REFLEX MICROSCOPIC
Glucose, UA: NEGATIVE
Hgb urine dipstick: NEGATIVE
Hgb urine dipstick: NEGATIVE
Nitrite: NEGATIVE
Nitrite: NEGATIVE
Protein, ur: NEGATIVE
Specific Gravity, Urine: 1.015
Specific Gravity, Urine: 1.02
Urobilinogen, UA: 1
Urobilinogen, UA: 0.2
Urobilinogen, UA: 0.2
pH: 8

## 2011-09-23 LAB — DIFFERENTIAL
Lymphs Abs: 1.7
Monocytes Absolute: 0.5
Monocytes Relative: 5
Neutro Abs: 7.9 — ABNORMAL HIGH
Neutrophils Relative %: 78 — ABNORMAL HIGH

## 2011-09-23 LAB — CBC
Hemoglobin: 11 — ABNORMAL LOW
MCV: 94.5
RBC: 3.43 — ABNORMAL LOW
WBC: 10.2

## 2011-09-23 LAB — URINE MICROSCOPIC-ADD ON

## 2011-09-24 LAB — URINALYSIS, ROUTINE W REFLEX MICROSCOPIC
Glucose, UA: NEGATIVE
Ketones, ur: NEGATIVE
Protein, ur: NEGATIVE

## 2011-10-04 LAB — BUN: BUN: 11

## 2011-10-04 LAB — CBC
Hemoglobin: 12.9
MCHC: 34.6
RBC: 4

## 2011-10-04 LAB — DIFFERENTIAL
Lymphocytes Relative: 28
Lymphs Abs: 2.5
Monocytes Absolute: 0.7
Monocytes Relative: 7
Neutro Abs: 5.7

## 2011-10-04 LAB — URINALYSIS, ROUTINE W REFLEX MICROSCOPIC
Bilirubin Urine: NEGATIVE
Ketones, ur: 15 — AB
Nitrite: NEGATIVE
pH: 6

## 2011-10-04 LAB — AST: AST: 15

## 2011-10-07 LAB — URINALYSIS, ROUTINE W REFLEX MICROSCOPIC
Glucose, UA: NEGATIVE
Hgb urine dipstick: NEGATIVE
Ketones, ur: NEGATIVE
Protein, ur: NEGATIVE
Specific Gravity, Urine: 1.015
Urobilinogen, UA: 1
pH: 7

## 2011-10-07 LAB — POCT PREGNANCY, URINE
Operator id: 257211
Preg Test, Ur: POSITIVE

## 2011-10-07 LAB — ABO/RH: ABO/RH(D): O POS

## 2011-10-07 LAB — CBC
Hemoglobin: 13.4
MCHC: 33.5
MCV: 92.8
Platelets: 158
RBC: 4.3

## 2011-10-07 LAB — WET PREP, GENITAL: Yeast Wet Prep HPF POC: NONE SEEN

## 2011-10-07 LAB — HCG, QUANTITATIVE, PREGNANCY: hCG, Beta Chain, Quant, S: 501 — ABNORMAL HIGH

## 2013-01-28 LAB — POC URINE MACROSCOPIC
Bilirubin: NEGATIVE
Blood: NEGATIVE
Glucose: NEGATIVE mg/dl
Ketone: NEGATIVE mg/dl
Leukocyte Esterase: NEGATIVE
Nitrites: NEGATIVE
Specific gravity: 1.02 (ref 1.005–1.030)
Urobilinogen: 1 EU/dl (ref 0.0–1.0)
pH (UA): 7.5 (ref 5–9)

## 2013-01-28 LAB — CT/GC DNA (PROBETEC)
CHLAMYDIA TRACHOMATIS DNA: NOT DETECTED
NEISSERIA GONORRHOEAE DNA: NOT DETECTED

## 2013-01-28 LAB — POC HCG,URINE: HCG urine, QL: NEGATIVE
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
## 2014-08-31 LAB — POC URINE MACROSCOPIC
Glucose: NEGATIVE mg/dl
Ketone: 40 mg/dl — AB
Leukocyte Esterase: NEGATIVE
Nitrites: NEGATIVE
Protein: 30 mg/dl — AB
Specific gravity: >=1.03 (ref 1.005–1.030)
Urobilinogen: 1 EU/dl (ref 0.0–1.0)
pH (UA): 5.5 (ref 5–9)

## 2014-08-31 LAB — CBC WITH AUTOMATED DIFF
BASOPHILS: 0.5 % (ref 0–3)
EOSINOPHILS: 1.2 % (ref 0–5)
HCT: 35.2 % — ABNORMAL LOW (ref 37.0–50.0)
HGB: 12 gm/dl — ABNORMAL LOW (ref 13.0–17.2)
IMMATURE GRANULOCYTES: 0.2 % (ref 0.0–3.0)
LYMPHOCYTES: 36.5 % (ref 28–48)
MCH: 32.6 pg (ref 25.4–34.6)
MCHC: 34.1 gm/dl (ref 30.0–36.0)
MCV: 95.7 fL (ref 80.0–98.0)
MONOCYTES: 7.6 % (ref 1–13)
MPV: 10.9 fL — ABNORMAL HIGH (ref 6.0–10.0)
NEUTROPHILS: 54 % (ref 34–64)
NRBC: 0 (ref 0–0)
PLATELET: 151 10*3/uL (ref 140–450)
RBC: 3.68 M/uL (ref 3.60–5.20)
RDW-SD: 48.2 — ABNORMAL HIGH (ref 36.4–46.3)
WBC: 9.9 10*3/uL (ref 4.0–11.0)

## 2014-08-31 LAB — METABOLIC PANEL, BASIC
BUN: 18 mg/dl (ref 7–25)
CO2: 25 mEq/L (ref 21–32)
Calcium: 9.1 mg/dl (ref 8.5–10.1)
Chloride: 107 mEq/L (ref 98–107)
Creatinine: 0.8 mg/dl (ref 0.6–1.3)
GFR est AA: >60
GFR est non-AA: >60
Glucose: 101 mg/dl (ref 74–106)
Sodium: 138 mEq/L (ref 136–145)

## 2014-08-31 LAB — POC HCG,URINE: HCG urine, QL: NEGATIVE

## 2015-04-10 ENCOUNTER — Emergency Department: Admit: 2015-04-11 | Payer: Self-pay

## 2015-04-10 DIAGNOSIS — S022XXA Fracture of nasal bones, initial encounter for closed fracture: Secondary | ICD-10-CM
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
## 2015-04-11 ENCOUNTER — Inpatient Hospital Stay
Admit: 2015-04-11 | Discharge: 2015-04-11 | Disposition: A | Discharge status: Home / Self Care | Payer: Self-pay | Attending: Emergency Medical Services

## 2015-04-11 MED ORDER — IBUPROFEN 800 MG TAB
800 mg | ORAL_TABLET | Freq: Three times a day (TID) | ORAL | Status: AC | PRN
Start: 2015-04-11 — End: ?

## 2015-04-11 MED ORDER — AZITHROMYCIN 500 MG TAB
500 mg | ORAL_TABLET | Freq: Every day | ORAL | Status: AC
Start: 2015-04-11 — End: 2015-04-16

## 2015-04-11 MED ORDER — PROPARACAINE 0.5 % EYE DROPS
0.5 % | OPHTHALMIC | Status: DC
Start: 2015-04-11 — End: 2015-04-11

## 2015-04-11 MED ORDER — FLUORESCEIN 1 MG EYE STRIPS
1 mg | OPHTHALMIC | Status: DC
Start: 2015-04-11 — End: 2015-04-11

## 2015-04-11 MED FILL — FLUORESCEIN 1 MG EYE STRIPS: 1 mg | OPHTHALMIC | Qty: 1

## 2015-04-11 MED FILL — PROPARACAINE 0.5 % EYE DROPS: 0.5 % | OPHTHALMIC | Qty: 15
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
## 2015-04-11 NOTE — ED Notes (Signed)
12:39 AM  04/11/2015     Discharge instructions given to pt with verbalization of understanding. Patient accompanied by self.  Patient discharged with the following prescriptions . Patient discharged to home.      Luz BrazenARA NOWLAND, RN

## 2016-03-16 ENCOUNTER — Encounter (HOSPITAL_COMMUNITY): Payer: Self-pay | Admitting: *Deleted

## 2016-03-16 ENCOUNTER — Emergency Department (HOSPITAL_COMMUNITY)
Admission: EM | Admit: 2016-03-16 | Discharge: 2016-03-16 | Disposition: A | Discharge status: Other Acute Inpatient Hospital | Payer: Medicaid Other | Attending: Emergency Medicine | Admitting: Emergency Medicine

## 2016-03-16 ENCOUNTER — Inpatient Hospital Stay (HOSPITAL_COMMUNITY)
Admission: AD | Admit: 2016-03-16 | Discharge: 2016-03-20 | DRG: 882 | Disposition: A | Discharge status: Home / Self Care | Payer: Medicaid Other | Attending: Psychiatry | Admitting: Psychiatry

## 2016-03-16 DIAGNOSIS — F23 Brief psychotic disorder: Secondary | ICD-10-CM | POA: Diagnosis not present

## 2016-03-16 DIAGNOSIS — G479 Sleep disorder, unspecified: Secondary | ICD-10-CM | POA: Insufficient documentation

## 2016-03-16 DIAGNOSIS — F39 Unspecified mood [affective] disorder: Secondary | ICD-10-CM

## 2016-03-16 DIAGNOSIS — F17210 Nicotine dependence, cigarettes, uncomplicated: Secondary | ICD-10-CM | POA: Diagnosis present

## 2016-03-16 DIAGNOSIS — G4700 Insomnia, unspecified: Secondary | ICD-10-CM | POA: Diagnosis not present

## 2016-03-16 DIAGNOSIS — F410 Panic disorder [episodic paroxysmal anxiety] without agoraphobia: Secondary | ICD-10-CM | POA: Diagnosis present

## 2016-03-16 DIAGNOSIS — F419 Anxiety disorder, unspecified: Secondary | ICD-10-CM | POA: Insufficient documentation

## 2016-03-16 DIAGNOSIS — F4323 Adjustment disorder with mixed anxiety and depressed mood: Secondary | ICD-10-CM | POA: Diagnosis not present

## 2016-03-16 DIAGNOSIS — R45851 Suicidal ideations: Secondary | ICD-10-CM

## 2016-03-16 DIAGNOSIS — F22 Delusional disorders: Secondary | ICD-10-CM | POA: Insufficient documentation

## 2016-03-16 DIAGNOSIS — F4310 Post-traumatic stress disorder, unspecified: Secondary | ICD-10-CM | POA: Diagnosis present

## 2016-03-16 DIAGNOSIS — F29 Unspecified psychosis not due to a substance or known physiological condition: Secondary | ICD-10-CM | POA: Diagnosis not present

## 2016-03-16 LAB — COMPREHENSIVE METABOLIC PANEL
ALBUMIN: 5 g/dL (ref 3.5–5.0)
ALK PHOS: 59 U/L (ref 38–126)
ALT: 14 U/L (ref 14–54)
ANION GAP: 13 (ref 5–15)
AST: 24 U/L (ref 15–41)
BILIRUBIN TOTAL: 1.2 mg/dL (ref 0.3–1.2)
BUN: 13 mg/dL (ref 6–20)
CALCIUM: 9.5 mg/dL (ref 8.9–10.3)
CO2: 21 mmol/L — AB (ref 22–32)
CREATININE: 0.78 mg/dL (ref 0.44–1.00)
Chloride: 104 mmol/L (ref 101–111)
GFR calc Af Amer: >60 mL/min (ref >60)
GFR calc non Af Amer: >60 mL/min (ref >60)
GLUCOSE: 91 mg/dL (ref 65–99)
Potassium: 3.7 mmol/L (ref 3.5–5.1)
SODIUM: 138 mmol/L (ref 135–145)
TOTAL PROTEIN: 8.2 g/dL — AB (ref 6.5–8.1)

## 2016-03-16 LAB — CBC
HEMATOCRIT: 37.7 % (ref 36.0–46.0)
HEMOGLOBIN: 12.9 g/dL (ref 12.0–15.0)
MCH: 31.4 pg (ref 26.0–34.0)
MCHC: 34.2 g/dL (ref 30.0–36.0)
MCV: 91.7 fL (ref 78.0–100.0)
Platelets: 186 10*3/uL (ref 150–400)
RBC: 4.11 MIL/uL (ref 3.87–5.11)
RDW: 13.1 % (ref 11.5–15.5)
WBC: 9.2 10*3/uL (ref 4.0–10.5)

## 2016-03-16 LAB — ACETAMINOPHEN LEVEL

## 2016-03-16 LAB — ETHANOL: Alcohol, Ethyl (B): <5 mg/dL (ref <5)

## 2016-03-16 LAB — SALICYLATE LEVEL: Salicylate Lvl: <4 mg/dL (ref 2.8–30.0)

## 2016-03-16 MED ORDER — OLANZAPINE 10 MG PO TABS
10.0000 mg | ORAL_TABLET | Freq: Two times a day (BID) | ORAL | Status: DC
  Administered 2016-03-16 (×2): 10 mg via ORAL
  Filled 2016-03-16 (×2): qty 1

## 2016-03-16 MED ORDER — ZOLPIDEM TARTRATE 5 MG PO TABS
5.0000 mg | ORAL_TABLET | Freq: Every evening | ORAL | Status: DC | PRN
Start: 2016-03-16 — End: 2016-03-16

## 2016-03-16 MED ORDER — LORAZEPAM 2 MG/ML IJ SOLN
1.0000 mg | Freq: Once | INTRAMUSCULAR | Status: AC
Start: 2016-03-16 — End: 2016-03-16
  Administered 2016-03-16: 1 mg via INTRAMUSCULAR
  Filled 2016-03-16: qty 1

## 2016-03-16 MED ORDER — NICOTINE 21 MG/24HR TD PT24: 21.0000 mg | MEDICATED_PATCH | Freq: Every day | TRANSDERMAL | Status: DC

## 2016-03-16 MED ORDER — ONDANSETRON HCL 4 MG PO TABS: 4.0000 mg | ORAL_TABLET | Freq: Three times a day (TID) | ORAL | Status: DC | PRN

## 2016-03-16 MED ORDER — ACETAMINOPHEN 325 MG PO TABS: 650.0000 mg | ORAL_TABLET | ORAL | Status: DC | PRN

## 2016-03-16 MED ORDER — LORAZEPAM 1 MG PO TABS
1.0000 mg | ORAL_TABLET | Freq: Three times a day (TID) | ORAL | Status: DC | PRN
Start: 2016-03-16 — End: 2016-03-16
  Administered 2016-03-16: 1 mg via ORAL
  Filled 2016-03-16: qty 1
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
## 2016-03-16 NOTE — ED Notes (Addendum)
Pt initially calm when she woke up but became  Tearful, demanding her phone and insistant on leaving.  Pt is aware that she can not leave and IVC discussed  with her.  Pt requested to talk with another MD, EDP contacted and will talk with pt.  Pt is aware.

## 2016-03-16 NOTE — Clinical Social Work Note (Signed)
CSW provided examination paperwork for MD who upheld IVC status.  CSW will fax to magistrate and put copies in shadow chart  .Elray Bubaegina Briante Loveall, LCSW [facility] Clinical Social Worker - Weekend Coverage cell #: [phone]

## 2016-03-16 NOTE — ED Notes (Signed)
Report received from Janie Rambo RN. Patient alert and oriented, warm and dry, in no acute distress. Patient denies SI, HI, AVH and pain. Patient made aware of Q15 minute rounds and security cameras for their safety. Patient instructed to come to me with needs or concerns.  
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
## 2016-03-16 NOTE — ED Notes (Signed)
Pt. Continues to get more anxious and aggressive threatening staff with physical harm.

## 2016-03-16 NOTE — ED Notes (Signed)
Patient noted in room. No complaints, stable, in no acute distress. Q15 minute rounds and monitoring via Security Cameras to continue.  

## 2016-03-16 NOTE — ED Notes (Signed)
Bed: [id number] Expected date:  Expected time:  Means of arrival:  Comments: EMS 35yo F hallucinations
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
## 2016-03-16 NOTE — ED Notes (Signed)
Writer asked patient to give urine, and she refused she said she is in hiding and she is not playing those games. It was explained to her that we just want to make sure she is ok she does not have a urinary track infection, or pregnant. I also explained the psychiatrists will be here in the morning and he might want her urine she said she is not giving it and that is her right

## 2016-03-16 NOTE — ED Notes (Signed)
Calmer, reasurred that she is safe here and that no one is going to hurt her.  Pt tearful at times and keeps repeating "I'm scared.Maria Maldonado.Maria Maldonado.I'm not crazy.Maria Maldonado.Maria Maldonado."

## 2016-03-16 NOTE — ED Notes (Signed)
Up tot he bathroom to shower and change scrubs 

## 2016-03-16 NOTE — ED Notes (Signed)
Pt. Pacing refusing to take medications, threatening staff. Stating she will beat their asses.

## 2016-03-16 NOTE — ED Notes (Signed)
Patient noted sleeping in room. No complaints, stable, in no acute distress. Q15 minute rounds and monitoring via Security Cameras to continue.  

## 2016-03-16 NOTE — ED Notes (Addendum)
On the phone, crying, scared, repeating that "my life is in danger." Pt refusing to give a urine sample.
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
## 2016-03-16 NOTE — ED Notes (Signed)
Pt. Transferred to [facility] from ED to room 37. [facility]. Pt. Oriented to unit including Q15 minute rounds as well as the security cameras for their protection. Patient is alert and oriented, warm and dry in no acute distress. Patient denies SI, HI, and AVH. Pt. Encouraged to let me know if needs arise.

## 2016-03-16 NOTE — ED Notes (Signed)
Patient noted in room with visitor. No complaints, stable, in no acute distress. Q15 minute rounds and monitoring via [employer] Cam[employer] to continue.

## 2016-03-16 NOTE — ED Notes (Signed)
Pt. Anxious asking the same questions repeatedly.
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
## 2016-03-16 NOTE — ED Notes (Signed)
On the phone with her father, still tearful at times, afraid that someone is going to hurt her support given.

## 2016-03-16 NOTE — ED Notes (Signed)
Patient changed into maroon scrubs.  Security  Called to wand patient.
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
## 2016-03-17 ENCOUNTER — Encounter (HOSPITAL_COMMUNITY): Payer: Self-pay

## 2016-03-17 DIAGNOSIS — F4323 Adjustment disorder with mixed anxiety and depressed mood: Secondary | ICD-10-CM | POA: Diagnosis present

## 2016-03-17 DIAGNOSIS — F4310 Post-traumatic stress disorder, unspecified: Secondary | ICD-10-CM | POA: Diagnosis present

## 2016-03-17 DIAGNOSIS — F29 Unspecified psychosis not due to a substance or known physiological condition: Secondary | ICD-10-CM | POA: Diagnosis present

## 2016-03-17 MED ORDER — LORAZEPAM 1 MG PO TABS
1.0000 mg | ORAL_TABLET | Freq: Three times a day (TID) | ORAL | Status: DC | PRN
  Administered 2016-03-17 (×2): 1 mg via ORAL
  Filled 2016-03-17 (×2): qty 1

## 2016-03-17 MED ORDER — ONDANSETRON HCL 4 MG PO TABS: 4.0000 mg | ORAL_TABLET | Freq: Three times a day (TID) | ORAL | Status: DC | PRN

## 2016-03-17 MED ORDER — MAGNESIUM HYDROXIDE 400 MG/5ML PO SUSP: 30.0000 mL | Freq: Every day | ORAL | Status: DC | PRN

## 2016-03-17 MED ORDER — NICOTINE 21 MG/24HR TD PT24
21.0000 mg | MEDICATED_PATCH | Freq: Every day | TRANSDERMAL | Status: DC
  Administered 2016-03-17 – 2016-03-20 (×4): 21 mg via TRANSDERMAL
  Filled 2016-03-17 (×6): qty 1

## 2016-03-17 MED ORDER — TRAZODONE HCL 50 MG PO TABS
50.0000 mg | ORAL_TABLET | Freq: Every day | ORAL | Status: DC
  Administered 2016-03-17 – 2016-03-18 (×2): 50 mg via ORAL
  Filled 2016-03-17 (×4): qty 1

## 2016-03-17 MED ORDER — ALUM & MAG HYDROXIDE-SIMETH 200-200-20 MG/5ML PO SUSP: 30.0000 mL | ORAL | Status: DC | PRN

## 2016-03-17 MED ORDER — ACETAMINOPHEN 325 MG PO TABS
650.0000 mg | ORAL_TABLET | Freq: Four times a day (QID) | ORAL | Status: DC | PRN
  Administered 2016-03-18: 650 mg via ORAL
  Filled 2016-03-17: qty 2

## 2016-03-17 MED ORDER — ZOLPIDEM TARTRATE 5 MG PO TABS: 5.0000 mg | ORAL_TABLET | Freq: Every evening | ORAL | Status: DC | PRN

## 2016-03-17 MED ORDER — OLANZAPINE 10 MG PO TABS
10.0000 mg | ORAL_TABLET | Freq: Two times a day (BID) | ORAL | Status: DC
  Administered 2016-03-17 – 2016-03-18 (×2): 10 mg via ORAL
  Filled 2016-03-17 (×7): qty 1

## 2016-03-17 NOTE — Progress Notes (Deleted)
Adult Psychoeducational Group Note  Date:  03/17/2016 Time:  9:09 PM  Group Topic/Focus:  Wrap-Up Group:   The focus of this group is to help patients review their daily goal of treatment and discuss progress on daily workbooks.  Participation Level:  Active  Participation Quality:  Intrusive  Affect:  Irritable  Cognitive:  Disorganized and Confused  Insight: Lacking  Engagement in Group:  Off Topic  Modes of Intervention:  Discussion  Additional Comments:  Pt was upset because she thought her peers was laughing at her during a card game. Pt made threats and was asked to leave group.   Merlinda FrederickKeshia S Elisabella Hacker 03/17/2016, 9:09 PM

## 2016-03-17 NOTE — Tx Team (Signed)
Initial Interdisciplinary Treatment Plan   PATIENT STRESSORS: Traumatic event   PATIENT STRENGTHS: Active sense of humor Communication skills Supportive family/friends   PROBLEM LIST: Problem List/Patient Goals Date to be addressed Date deferred Reason deferred Estimated date of resolution  ""gain more weight"      "have less stress"      Psychosis pt. Believes people are after her.  She denies ever having any suicidal thoughts                                           DISCHARGE CRITERIA:  Improved stabilization in mood, thinking, and/or behavior Motivation to continue treatment in a less acute level of care Verbal commitment to aftercare and medication compliance  PRELIMINARY DISCHARGE PLAN: Outpatient therapy Return to previous living arrangement Return to previous work or school arrangements  PATIENT/FAMIILY INVOLVEMENT: This treatment plan has been presented to and reviewed with the patient, Maryelizabeth Kaufmannosha T Tomkinson, and/or family member, .  The patient and family have been given the opportunity to ask questions and make suggestions.  Cooper RenderSadler, Benjamyn Hestand Jean Horne 03/17/2016, 5:26 AM

## 2016-03-17 NOTE — [facility] Suicide Risk Assessment (Signed)
[facility]   Nursing information obtained from:    Demographic factors:    Current Mental Status:    Loss Factors:    Historical Factors:    Risk Reduction Factors:     Total Time spent with patient: 1.5 hours Principal Problem: <principal problem not specified> Diagnosis:   Patient Active Problem List   Diagnosis Date Noted  . Psychosis [F29] 03/17/2016  . 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 Paranoid and intrusive. Poor insight. Has refulsed to give urine sample.   Continued Clinical Symptoms:  Alcohol Use Disorder Identification Test Final Score (AUDIT): 1 The "Alcohol Use Disorders Identification Test", Guidelines for Use in Primary Care, Second Edition.  World Science writerHealth Organization [facility](WHO). Score between 0-7:  no or low risk or alcohol related problems. Score between 8-15:  moderate risk of alcohol related problems. Score between 16-19:  high risk of alcohol related problems. Score 20 or above:  warrants further diagnostic evaluation for alcohol dependence and treatment.   CLINICAL FACTORS:   Currently Psychotic   Musculoskeletal: Strength & Muscle Tone: within normal limits Gait & Station: normal Patient leans: no lean  Psychiatric Specialty Exam: Review of Systems  Constitutional: Negative for fever.  Cardiovascular: Negative for chest pain.  Skin: Negative for rash.  Psychiatric/Behavioral: The patient is nervous/anxious.     Blood pressure 110/76, pulse 88, temperature 97.8 F (36.6 C), temperature source Oral, resp. rate 18, height 5\' 2"  (1.575 m), weight 46.267 kg (102 lb), last menstrual period 03/14/2016.Body mass index is 18.65 kg/(m^2).  General Appearance: Casual  Eye Contact::  Fair  Speech:  Normal Rate  Volume:  Increased  Mood:  Irritable  Affect:  Labile  Thought Process:  Disorganized  Orientation:  Full (Time, Place, and Person)  Thought Content:  Paranoid  Ideation and Rumination  Suicidal Thoughts:  No  Homicidal Thoughts:  No  Memory:  Immediate;   Fair Recent;   Fair  Judgement:  Poor  Insight:  Shallow  Psychomotor Activity:  Normal  Concentration:  Fair  Recall:  [employer]  Fund of Knowledge:Fair  Language: Fair  Akathisia:  Negative  Handed:  Right  AIMS (if indicated):     Assets:  Financial Resources/Insurance Physical Health  Sleep:  Number of Hours: 3.75  Cognition: WNL  ADL's:  Intact    COGNITIVE FEATURES THAT CONTRIBUTE TO RISK:  Closed-mindedness and Polarized thinking    SUICIDE RISK:   Moderate:  Frequent suicidal ideation with limited intensity, and duration, some specificity in terms of plans, no associated intent, good self-control, limited dysphoria/symptomatology, some risk factors present, and identifiable protective factors, including available and accessible social support.  PLAN OF CARE: Patient remains paranoid and would admit for safety and stabilization. Medication management.   I certify that inpatient services furnished can reasonably be expected to improve the patient's condition.   Thresa RossAKHTAR, [place] Hijazi, MD 03/17/2016, 10:43 AM

## 2016-03-17 NOTE — [facility] Group Notes (Signed)
[facility] Group Notes:  (Clinical Social Work)  03/17/2016  11:00AM-12:00PM  Summary of Progress/Problems:  The main focus of today's process group was to listen to a variety of genres of music and to identify that different types of music provoke different responses.  The patient then was able to identify personally what was soothing for them, as well as energizing, as well as how patient can personally use this knowledge in sleep habits, with depression, and with other symptoms.  The patient expressed at the beginning of group the overall feeling of "good" and she sang/danced quite a bit.  She was in and out of the room consistently though.  Type of Therapy:  Music Therapy   Participation Level:  Active  Participation Quality:  Attentive   Affect:  Blunted  Cognitive:  Disorganized  Insight:  Limited  Engagement in Therapy: Limited  Modes of Intervention:   Activity, Exploration  Ambrose MantleMareida Grossman-Orr, LCSW 03/17/2016

## 2016-03-17 NOTE — Progress Notes (Signed)
D Pt. Denies SI and HI, no complaints of pain or discomfort noted  At present time.  Pt. Denies A and or VH.  A Writer offered support and encouragement, discussed pt.'s day with her.  R Pt. Appears calmer tonight than during her admission the prior evening,  Pt. Refuses to give a urine specimen.  It was reported that a family member feels the pt. May have been using cocaine and could be the reason she is refusing to give us the urine specimen.  Pt. Does continue to express the need to be discharged stating that she is not crazy and does not belong here. Pt. Remains safe on the unit.
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
## 2016-03-17 NOTE — Progress Notes (Signed)
Zyprexa not given. Pt asleep and requires rest at this time. Pt has not returned urine specimen.
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
## 2016-03-17 NOTE — Progress Notes (Signed)
Adult Psychoeducational Group Note  Date:  03/17/2016 Time:  9:12 PM  Group Topic/Focus:  Wrap-Up Group:   The focus of this group is to help patients review their daily goal of treatment and discuss progress on daily workbooks.  Participation Level:  Active  Participation Quality:  Attentive  Affect:  Appropriate  Cognitive:  Appropriate  Insight: Appropriate  Engagement in Group:  Engaged  Modes of Intervention:  Discussion  Additional Comments:  Pt had a good day.Pt stated her goal is to go home.   Merlinda FrederickKeshia S Borna Wessinger 03/17/2016, 9:12 PM

## 2016-03-17 NOTE — Progress Notes (Signed)
Patient ID: Maria Maldonado, female   DOB: [birth year], 35 y.o.   MRN: [id number] Attempt to complete PSA with patient was not completed as patient had just met with PA and was reportedly "all over the place."   Sheilah Pigeon, LCSW
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
## 2016-03-18 DIAGNOSIS — F4323 Adjustment disorder with mixed anxiety and depressed mood: Principal | ICD-10-CM

## 2016-03-18 DIAGNOSIS — F23 Brief psychotic disorder: Secondary | ICD-10-CM

## 2016-03-18 LAB — RAPID URINE DRUG SCREEN, HOSP PERFORMED
AMPHETAMINES: NOT DETECTED
Barbiturates: NOT DETECTED
Benzodiazepines: POSITIVE — AB
Cocaine: NOT DETECTED
OPIATES: NOT DETECTED
Tetrahydrocannabinol: POSITIVE — AB

## 2016-03-18 LAB — PREGNANCY, URINE: Preg Test, Ur: NEGATIVE

## 2016-03-18 MED ORDER — OLANZAPINE 10 MG PO TABS
10.0000 mg | ORAL_TABLET | Freq: Every day | ORAL | Status: AC
  Administered 2016-03-18: 10 mg via ORAL
  Filled 2016-03-18: qty 1

## 2016-03-18 MED ORDER — SERTRALINE HCL 25 MG PO TABS
25.0000 mg | ORAL_TABLET | Freq: Every day | ORAL | Status: DC
  Administered 2016-03-18 – 2016-03-20 (×3): 25 mg via ORAL
  Filled 2016-03-18 (×7): qty 1

## 2016-03-18 MED ORDER — ENSURE ENLIVE PO LIQD
237.0000 mL | Freq: Two times a day (BID) | ORAL | Status: DC
  Administered 2016-03-18 – 2016-03-20 (×5): 237 mL via ORAL

## 2016-03-18 MED ORDER — OLANZAPINE 10 MG PO TABS
20.0000 mg | ORAL_TABLET | Freq: Every day | ORAL | Status: DC
  Filled 2016-03-18: qty 2
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
## 2016-03-18 NOTE — [facility] Group Notes (Signed)
[facility] LCSW Group Therapy  03/18/2016 1:15 pm  Type of Therapy: Process Group Therapy  Participation Level:  Active  Participation Quality:  Appropriate  Affect:  Flat  Cognitive:  Oriented  Insight:  Improving  Engagement in Group:  Limited  Engagement in Therapy:  Limited  Modes of Intervention:  Activity, Clarification, Education, Problem-solving and Support  Summary of Progress/Problems: Today's group addressed the issue of overcoming obstacles.  Patients were asked to identify their biggest obstacle post d/c that stands in the way of their on-going success, and then problem solve as to how to manage this. Stayed for most of group.  C/O feeling sleepy towards the end and left to lay down.  While in group, talked about being "versatile" which will help her deal with her obstacle of getting a new job.  "There's lots of places I could go that don't have money that people want to steal.  Like a call center, or a factory.  I will find something because I have to provide for my family.  It just has to be a safe place.  I'll know."    Maria Maldonado, Maria Maldonado 03/18/2016   3:28 PM
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
## 2016-03-18 NOTE — [facility] Suicide Risk Assessment (Signed)
[facility] INPATIENT:  Family/Significant Other Suicide Prevention Education  Suicide Prevention Education:  Education Completed; No one has been identified by the patient as the family member/significant other with whom the patient will be residing, and identified as the person(s) who will aid the patient in the event of a mental health crisis (suicidal ideations/suicide attempt).  With written consent from the patient, the family member/significant other has been provided the following suicide prevention education, prior to the and/or following the discharge of the patient.  The suicide prevention education provided includes the following:  Suicide risk factors  Suicide prevention and interventions  National Suicide Hotline telephone number  [facility] assessment telephone number  [facility] 911  [facility] and/or [facility] telephone number  Request made of family/significant other to:  Remove weapons (e.g., guns, rifles, knives), all items previously/currently identified as safety concern.    Remove drugs/medications (over-the-counter, prescriptions, illicit drugs), all items previously/currently identified as a safety concern.  The family member/significant other verbalizes understanding of the suicide prevention education information provided.  The family member/significant other agrees to remove the items of safety concern listed above. The patient did not endorse SI at the time of admission, nor did the patient c/o SI during the stay here.  SPE not required.   Daryel Geraldorth, Holli Rengel B 03/18/2016, 12:51 PM

## 2016-03-18 NOTE — Progress Notes (Signed)
Adult Psychoeducational Group Note  Date:  03/18/2016 Time:  10:05 PM  Group Topic/Focus:  Wrap-Up Group:   The focus of this group is to help patients review their daily goal of treatment and discuss progress on daily workbooks.  Participation Level:  Active  Participation Quality:  Intrusive  Affect:  Labile  Cognitive:  Oriented  Insight: Limited  Engagement in Group:  Engaged  Modes of Intervention:  Socialization and Support  Additional Comments:  Patient attended and participated in group tonight. She reports that she had a beautiful day. She slept a lot which took some stress off her. She socialized and eat well.  Lita MainsFrancis, Vidhi Delellis [facility] 03/18/2016, 10:05 PM

## 2016-03-18 NOTE — Progress Notes (Signed)
NUTRITION ASSESSMENT  Pt identified as at risk on the Malnutrition Screen Tool  INTERVENTION: 1. Educated patient on the importance of nutrition and encouraged intake of food and beverages. 2. Discussed weight goals. 3. Supplements: will order Ensure Enlive po BID, each supplement provides 350 kcal and 20 grams of protein   NUTRITION DIAGNOSIS: Unintentional weight loss related to sub-optimal intake as evidenced by pt report.   Goal: Pt to meet >/= 90% of their estimated nutrition needs.  Monitor:  PO intake  Assessment:  Pt admitted for psychosis with depression and PTSD. Pt has recently lost 8 lbs (7% body weight) but unsure of time frame for weight loss. Will order Ensure to supplement as pt's medications are adjusted and she begins to get back to normal eating patterns.    35 y.o. female  Height: Ht Readings from Last 1 Encounters:  03/17/16 5\' 2"  (1.575 m)    Weight: Wt Readings from Last 1 Encounters:  03/17/16 102 lb (46.267 kg)    Weight Hx: Wt Readings from Last 10 Encounters:  03/17/16 102 lb (46.267 kg)  03/16/16 110 lb (49.896 kg)    BMI:  Body mass index is 18.65 kg/(m^2). Pt meets criteria for normal weight/borderline underweight based on current BMI.  Estimated Nutritional Needs: Kcal: 25-30 kcal/kg Protein: > 1 gram protein/kg Fluid: 1 ml/kcal  Diet Order: Diet Heart Room service appropriate?: Yes; Fluid consistency:: Thin Pt is also offered choice of unit snacks mid-morning and mid-afternoon.  Pt is eating as desired.   Lab results and medications reviewed.      Trenton GammonJessica Tanishia Lemaster, RD, LDN Inpatient Clinical Dietitian Pager # [phone] After hours/weekend pager # [phone]

## 2016-03-19 LAB — LIPID PANEL
CHOL/HDL RATIO: 3.4 ratio
CHOLESTEROL: 137 mg/dL (ref 0–200)
HDL: 40 mg/dL — ABNORMAL LOW (ref >40)
LDL Cholesterol: 84 mg/dL (ref 0–99)
Triglycerides: 66 mg/dL (ref <150)
VLDL: 13 mg/dL (ref 0–40)

## 2016-03-19 LAB — TSH: TSH: 0.776 u[IU]/mL (ref 0.350–4.500)

## 2016-03-19 MED ORDER — TRAZODONE HCL 50 MG PO TABS: 50.0000 mg | ORAL_TABLET | Freq: Every evening | ORAL | Status: DC | PRN

## 2016-03-19 MED ORDER — OLANZAPINE 5 MG PO TABS
25.0000 mg | ORAL_TABLET | Freq: Every day | ORAL | Status: DC
  Administered 2016-03-19: 25 mg via ORAL
  Filled 2016-03-19 (×3): qty 1
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
## 2016-03-19 NOTE — Progress Notes (Signed)
D: Pt presents anxious on approach. Pt laughing, talking and engaging with other pts on the unit. During assessment pt had a difficult time answering questions and started talking about searching for her lost dog who she saw on t.v for 45.00 dollars. Writer had to redirect pt attention. Pt appears to be minimizing her symptoms and denies SI/HI/AVH. Pt denies depression, anxiety and paranoia. Pt appears paranoid during the day and often think that other pts on the unit are targeting her.  A: Medications administered as ordered per MD. Orders reviewed by writer. Verbal support provided. Pt encouraged to attend groups. 15 minute checks performed for safety.  R: Pt stated goal "to go home to children". Pt compliant with tx.

## 2016-03-19 NOTE — Progress Notes (Signed)
D: Pt at the time of assessment denies any form of depression, anxiety, pain, SI, HI or AVH; states, "there is nothing wrong with me; I have figure out what I need to do, I know what to do." Pt was observed have appropriate conversation with peers. Pt remained calm and cooperative. A: Medications offered as prescribed.  Support, encouragement, and safe environment provided.  15-minute safety checks continue. R: Pt was med compliant.  Pt attended wrap-up group. Safety checks continue

## 2016-03-19 NOTE — Progress Notes (Signed)
Recreation Therapy Notes  03.28.2017 approximately 8:05am. Per MD verbal order LRT met with patient to introduce stress management techniques patient can use post d/c to alleviate her increased stress level. MD educated LRT about circumstances surrounding patient admission.  LRT introduced diaphragmatic breathing and mindful breathing. LRT educated patient about use and times to use. Patient receptive to LRT, acknowledged education and demonstrated ability to practice independently post d/c. LRT additionally provided literature on techniques introduced in additional techniques she can use post d/c. Patient receptive.   Laureen Ochs Grayson White, LRT/CTRS   Lane Hacker 03/19/2016 9:37 AM

## 2016-03-19 NOTE — [facility] Group Notes (Signed)
[facility] Group Notes:  (Nursing/MHT/Case Management/Adjunct)  Date:  03/19/2016  Time:  11:33 AM  Type of Therapy:  Nurse Education  Participation Level:  Active  Participation Quality:  Appropriate and Attentive  Affect:  Appropriate  Cognitive:  Alert and Appropriate  Insight:  Appropriate and Good  Engagement in Group:  Engaged and Improving  Modes of Intervention:  Discussion and Education   Summary of Progress/Problems: Topic was on leisure and lifestyle changes. Discussed the importance of choosing a healthy leisure activities. Group encouraged to surround themselves with positive and healthy group/support system when changing to a healthy lifestyle. Patient was receptive and contributed.    Mickie Baillizabeth O Iwenekha 03/19/2016, 11:33 AM
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
## 2016-03-20 LAB — HEMOGLOBIN A1C
HEMOGLOBIN A1C: 5.3 % (ref 4.8–5.6)
MEAN PLASMA GLUCOSE: 105 mg/dL

## 2016-03-20 LAB — PROLACTIN: PROLACTIN: 140.7 ng/mL — AB (ref 4.8–23.3)

## 2016-03-20 MED ORDER — NICOTINE 21 MG/24HR TD PT24: 21.0000 mg | MEDICATED_PATCH | Freq: Every day | TRANSDERMAL | Status: AC

## 2016-03-20 MED ORDER — SERTRALINE HCL 25 MG PO TABS: 25.0000 mg | ORAL_TABLET | Freq: Every day | ORAL | Status: AC

## 2016-03-20 MED ORDER — TRAZODONE HCL 50 MG PO TABS: 50.0000 mg | ORAL_TABLET | Freq: Every evening | ORAL | Status: AC | PRN

## 2016-03-20 MED ORDER — OLANZAPINE 5 MG PO TABS: 25.0000 mg | ORAL_TABLET | Freq: Every day | ORAL | Status: AC

## 2016-03-20 NOTE — Plan of Care (Signed)
Problem: Ineffective individual coping Goal: STG: Patient will remain free from self harm Patient denies SI and has been able to atay free of self harm on the unit.  Problem: Alteration in thought process Goal: STG-Patient is able to sleep at least 6 hours per night Outcome: Not Progressing Patient only slept 5.75 hours last night. She did take her evening Zyprexa but did not take her evening trazodone.

## 2016-03-20 NOTE — Progress Notes (Signed)
Patient verbalizes readiness for discharge. Follow up plan explained, RXs provided and all belongings returned. Patient verbalizes understanding. Denies SI/HI and agrees to seek assistance should that status change Patient discharged ambulatory , in stable condition. Discharged to father of son's grandmother..Marland Kitchen
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
## 2016-03-20 NOTE — Progress Notes (Signed)
Patient has spent much of this morning in her room. Affect blunted and somewhat anxious. Speech slightly pressured. No delusions reported. Denies SI/HI and AVH. On her self inventory she reports depression, hopelessness and anxiety at a 0/10. During treatment team discharge plans initiated for today. Patients reports feeling, "happy" that she is going to be discharged today.
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
## 2016-03-20 NOTE — Progress Notes (Signed)
  [facility]  Will you be returning to the same living situation after discharge:  Yes,  home At discharge, do you have transportation home?: Yes,  family Do you have the ability to pay for your medications: Yes,  MCD  Release of information consent forms completed and in the chart;  Patient's signature needed at discharge.  Patient to Follow up at: Follow-up Information    Follow up with [facility] On 03/20/2016.   Why:  Today at 8PM with the Dr   Benay Pillowontact information:   [address] [place] [across from [employer]] [phone]      Next level of care provider has access to [facility] Link:no  Safety Planning and Suicide Prevention discussed: Yes,  yes  Have you used any form of tobacco in the last 30 days? (Cigarettes, Smokeless Tobacco, Cigars, and/or Pipes): Yes  Has patient been referred to the Quitline?: Yes, faxed on 03/20/16  Patient has been referred for addiction treatment: Yes  Maria Maldonado, Maria Maldonado 03/20/2016, 10:49 AM

## 2021-07-20 ENCOUNTER — Emergency Department: Admit: 2021-07-20 | Payer: PRIVATE HEALTH INSURANCE

## 2021-07-20 ENCOUNTER — Inpatient Hospital Stay
Admit: 2021-07-20 | Discharge: 2021-07-20 | Disposition: A | Discharge status: Home / Self Care | Payer: PRIVATE HEALTH INSURANCE | Attending: Emergency Medicine

## 2021-07-20 DIAGNOSIS — M79672 Pain in left foot: Secondary | ICD-10-CM

## 2021-07-20 MED ORDER — HYDROCODONE-ACETAMINOPHEN 5 MG-325 MG TAB
5-325 mg | ORAL | Status: AC
Start: 2021-07-20 — End: 2021-07-20
  Administered 2021-07-20: 16:00:00 via ORAL

## 2021-07-20 MED ORDER — NAPROXEN 375 MG TAB
375 mg | ORAL_TABLET | Freq: Two times a day (BID) | ORAL | 0 refills | Status: DC
Start: 2021-07-20 — End: 2021-07-20

## 2021-07-20 MED ORDER — NAPROXEN 375 MG TAB
375 mg | ORAL_TABLET | Freq: Two times a day (BID) | ORAL | 0 refills | Status: AC
Start: 2021-07-20 — End: ?

## 2021-07-20 MED FILL — HYDROCODONE-ACETAMINOPHEN 5 MG-325 MG TAB: 5-325 mg | ORAL | Qty: 1

## 2021-07-20 NOTE — ED Notes (Signed)
Pt wanted to speak with provider, PA notified.

## 2021-07-20 NOTE — ED Notes (Signed)
Pt c/o dorsal foot pain starting around 0900 this AM. No known injury. Unable to bear weight. Pt required assistance from parking lot by hospital security and ER staff.

## 2021-07-20 NOTE — ED Notes (Signed)
Pt back from xray

## 2021-07-20 NOTE — ED Provider Notes (Signed)
ED Provider Notes by Erline Hau, PA-C at 07/20/21 1042                Author: Erline Hau, PA-C  Service: Wound Care  Author Type: Physician Assistant       Filed: 07/20/21 1129  Date of Service: 07/20/21 1042  Status: Attested           Editor: Erline Hau, PA-C (Physician Assistant)  Cosigner: Erling Conte, MD at 07/21/21 1109          Attestation signed by Erling Conte, MD at 07/21/21 1109          I, Erling Conte, M.D., discussed with the advanced practice provider and agree with the evaluation and plans as documented here.  I was available to address  any questions during Emergency Department evaluation.                                 [facility]   Emergency Department Treatment Report          Patient: Sheena Nelson  Age: 40 y.o.  Sex: female          Date of Birth: October 20, 1981  Admit Date: 07/20/2021  PCP: None     MRN: [id number]   CSN: [id number]   Attending: Erling Conte         Room: T02/T02  Time Dictated: 10:42 AM  PA: [facility]        Chief Complaint         Chief Complaint       Patient presents with        ?  Foot Pain           History of Present Illness        40 y.o. female is here with left foot pain.  No injury.  She woke up this morning and her foot was hurting.  The top of the foot hurts.  No ankle pain.  She states she is unable to walk due to the pain.  She has never had anything like this before.  Pain  is localized to the top of the foot and does not radiate.  Pain is reproducible to touch and movement.  Pain exacerbated when she tries to step on it.        Review of Systems        Review of Systems    Constitutional:  Negative for chills and fever.    HENT:  Negative for congestion, ear pain and sore throat.     Respiratory:  Negative for cough, sputum production, shortness of breath and wheezing.     Cardiovascular:  Negative for chest pain, palpitations and leg swelling.    Gastrointestinal:  Negative for abdominal pain, nausea and vomiting.    Musculoskeletal:   Positive for joint pain. Negative for back pain and neck pain.    Skin:  Negative for rash.    Neurological:  Negative for dizziness, loss of consciousness, weakness and headaches.         Past Medical/Surgical History     No known chronic medical conditions   No past medical history on file.   No past surgical history on file.        Social History          Social History          Socioeconomic History         ?  Marital status:  SINGLE             Family History        No family history on file.        Current Medications          Current Outpatient Medications          Medication  Sig  Dispense  Refill           ?  ibuprofen (MOTRIN) 800 mg tablet  Take 1 Tab by mouth every eight (8) hours as needed for Pain (and inflammation.).  30 Tab  0             Allergies          Allergies        Allergen  Reactions         ?  Penicillins  Swelling             Physical Exam          ED Triage Vitals [07/20/21 1037]     ED Encounter Vitals Group           BP  99/86        Pulse (Heart Rate)  96        Resp Rate  16        Temp  98.1 ??F (36.7 ??C)        Temp src          O2 Sat (%)  100 %        Weight             Height          Physical Exam   Vitals reviewed.    Constitutional:        General: She is not in acute distress.      Appearance: Normal appearance.    HENT:       Head: Normocephalic and atraumatic.    Eyes:       Extraocular Movements: Extraocular movements intact.       Conjunctiva/sclera: Conjunctivae normal.       Pupils: Pupils are equal, round, and reactive to light.     Cardiovascular:       Rate and Rhythm: Normal rate and regular rhythm.       Pulses: Normal pulses.       Heart sounds: Normal heart sounds.    Pulmonary:       Effort: Pulmonary effort is normal. No respiratory distress.       Breath sounds: Normal breath sounds.     Musculoskeletal:          General: Normal range of motion.       Comments: She is able to move ankle and toes.  No heel pain and no calf pain.  Pain reproducible to palpation  at the top of the foot along the metatarsal bones with no specific bony tenderness.   No sole of the foot pain.    Skin:      General: Skin is warm and dry.    Neurological:       General: No focal deficit present.       Mental Status: She is alert and oriented to person, place, and time.    Psychiatric:          Mood and Affect: Mood normal.          Behavior: Behavior normal.  Impression and Management Plan     Patient presents with left foot pain that started this morning.  Pain is to the top of the foot. Unable to bear weight due to pain.      DDX considered but not limited to fracture, strain, sprain        Diagnostic Studies        Lab:    No results found for this or any previous visit (from the past 12 hour(s)).      Imaging:     XR FOOT LT MIN 3 V      Result Date: 07/20/2021   Exam: 3 views of the left foot. Clinical indications:  pain, dorsal foot pain Comparison:   None Findings:  No fracture.  No dislocation. No radiopaque foreign bodies.       Impression:  No fracture.          Medical Decision Making/ED Course        X-ray of left foot as interpreted by me does not show any fractures   Patient placed on ortho boot and given crutches   Naproxen and 375 twice daily for pain.  Follow-up with Ortho.        Final Diagnosis                 ICD-10-CM  ICD-9-CM          1.  Left foot pain   M79.672  729.5             Disposition        Patient is discharge home in stable condition with instructions to follow up with their regular doctor. They are advised to return immediately for any new/worsening symptoms or concerns.       Patient evaluated by myself and discussed with Dr. Kizzie Bane, Loraine Leriche C who agrees with the above assessment and plan.       Orlean Patten, PA-C   July 20, 2021      *Portions of this electronic record were dictated using Dragon voice recognition software.  Unintended errors in translation may occur.      My signature above authenticates this document and my orders, the final      diagnosis (es), discharge prescription (s), and instructions in the Epic     record.   If you have any questions please contact [phone].       Nursing notes have been reviewed by the physician/ advanced practice     Clinician.
